# Patient Record
Sex: Male | Born: 1945
Health system: Southern US, Community
[De-identification: ages and names within clinical notes are randomized; demographics above are authoritative.]

## PROBLEM LIST (undated history)

## (undated) DIAGNOSIS — N401 Enlarged prostate with lower urinary tract symptoms: Secondary | ICD-10-CM

## (undated) DIAGNOSIS — K760 Fatty (change of) liver, not elsewhere classified: Secondary | ICD-10-CM

## (undated) DIAGNOSIS — E119 Type 2 diabetes mellitus without complications: Secondary | ICD-10-CM

## (undated) DIAGNOSIS — N138 Other obstructive and reflux uropathy: Secondary | ICD-10-CM

## (undated) DIAGNOSIS — E785 Hyperlipidemia, unspecified: Secondary | ICD-10-CM

## (undated) DIAGNOSIS — I639 Cerebral infarction, unspecified: Secondary | ICD-10-CM

## (undated) HISTORY — DX: Benign prostatic hyperplasia with lower urinary tract symptoms: N40.1

## (undated) HISTORY — DX: Other obstructive and reflux uropathy: N13.8

## (undated) HISTORY — DX: Hyperlipidemia, unspecified: E78.5

## (undated) HISTORY — DX: Type 2 diabetes mellitus without complications: E11.9

## (undated) HISTORY — PX: CYSTOSCOPY: SUR368

## (undated) HISTORY — DX: Other obstructive and reflux uropathy: N40.1

## (undated) HISTORY — DX: Cerebral infarction, unspecified: I63.9

---
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
## 2016-05-31 DIAGNOSIS — R739 Hyperglycemia, unspecified: Secondary | ICD-10-CM | POA: Diagnosis not present

## 2016-05-31 DIAGNOSIS — R748 Abnormal levels of other serum enzymes: Secondary | ICD-10-CM | POA: Diagnosis not present

## 2016-05-31 DIAGNOSIS — E782 Mixed hyperlipidemia: Secondary | ICD-10-CM | POA: Diagnosis not present

## 2016-06-04 DIAGNOSIS — Z1389 Encounter for screening for other disorder: Secondary | ICD-10-CM | POA: Diagnosis not present

## 2016-06-04 DIAGNOSIS — R7301 Impaired fasting glucose: Secondary | ICD-10-CM | POA: Diagnosis not present

## 2016-06-04 DIAGNOSIS — N401 Enlarged prostate with lower urinary tract symptoms: Secondary | ICD-10-CM | POA: Diagnosis not present

## 2016-06-04 DIAGNOSIS — Z6839 Body mass index (BMI) 39.0-39.9, adult: Secondary | ICD-10-CM | POA: Diagnosis not present

## 2016-06-04 DIAGNOSIS — R748 Abnormal levels of other serum enzymes: Secondary | ICD-10-CM | POA: Diagnosis not present

## 2016-06-04 DIAGNOSIS — E782 Mixed hyperlipidemia: Secondary | ICD-10-CM | POA: Diagnosis not present

## 2016-11-26 DIAGNOSIS — E7800 Pure hypercholesterolemia, unspecified: Secondary | ICD-10-CM | POA: Diagnosis not present

## 2016-11-26 DIAGNOSIS — E782 Mixed hyperlipidemia: Secondary | ICD-10-CM | POA: Diagnosis not present

## 2016-11-26 DIAGNOSIS — R739 Hyperglycemia, unspecified: Secondary | ICD-10-CM | POA: Diagnosis not present

## 2016-12-03 DIAGNOSIS — Z6839 Body mass index (BMI) 39.0-39.9, adult: Secondary | ICD-10-CM | POA: Diagnosis not present

## 2016-12-03 DIAGNOSIS — E782 Mixed hyperlipidemia: Secondary | ICD-10-CM | POA: Diagnosis not present

## 2016-12-03 DIAGNOSIS — R7301 Impaired fasting glucose: Secondary | ICD-10-CM | POA: Diagnosis not present

## 2016-12-03 DIAGNOSIS — N401 Enlarged prostate with lower urinary tract symptoms: Secondary | ICD-10-CM | POA: Diagnosis not present

## 2016-12-03 DIAGNOSIS — R748 Abnormal levels of other serum enzymes: Secondary | ICD-10-CM | POA: Diagnosis not present

## 2017-04-01 DIAGNOSIS — R739 Hyperglycemia, unspecified: Secondary | ICD-10-CM | POA: Diagnosis not present

## 2017-04-01 DIAGNOSIS — R7301 Impaired fasting glucose: Secondary | ICD-10-CM | POA: Diagnosis not present

## 2017-04-01 DIAGNOSIS — E7800 Pure hypercholesterolemia, unspecified: Secondary | ICD-10-CM | POA: Diagnosis not present

## 2017-04-01 DIAGNOSIS — Z0001 Encounter for general adult medical examination with abnormal findings: Secondary | ICD-10-CM | POA: Diagnosis not present

## 2017-04-01 DIAGNOSIS — Z6836 Body mass index (BMI) 36.0-36.9, adult: Secondary | ICD-10-CM | POA: Diagnosis not present

## 2017-04-01 DIAGNOSIS — R0602 Shortness of breath: Secondary | ICD-10-CM | POA: Diagnosis not present

## 2017-04-01 DIAGNOSIS — E782 Mixed hyperlipidemia: Secondary | ICD-10-CM | POA: Diagnosis not present

## 2017-04-01 DIAGNOSIS — R5383 Other fatigue: Secondary | ICD-10-CM | POA: Diagnosis not present

## 2017-04-05 DIAGNOSIS — Z23 Encounter for immunization: Secondary | ICD-10-CM | POA: Diagnosis not present

## 2017-04-05 DIAGNOSIS — E782 Mixed hyperlipidemia: Secondary | ICD-10-CM | POA: Diagnosis not present

## 2017-04-05 DIAGNOSIS — N401 Enlarged prostate with lower urinary tract symptoms: Secondary | ICD-10-CM | POA: Diagnosis not present

## 2017-04-05 DIAGNOSIS — R748 Abnormal levels of other serum enzymes: Secondary | ICD-10-CM | POA: Diagnosis not present

## 2017-04-05 DIAGNOSIS — R7301 Impaired fasting glucose: Secondary | ICD-10-CM | POA: Diagnosis not present

## 2017-04-05 DIAGNOSIS — Z6838 Body mass index (BMI) 38.0-38.9, adult: Secondary | ICD-10-CM | POA: Diagnosis not present

## 2017-07-15 DIAGNOSIS — M9901 Segmental and somatic dysfunction of cervical region: Secondary | ICD-10-CM | POA: Diagnosis not present

## 2017-07-15 DIAGNOSIS — S338XXA Sprain of other parts of lumbar spine and pelvis, initial encounter: Secondary | ICD-10-CM | POA: Diagnosis not present

## 2017-07-15 DIAGNOSIS — M9903 Segmental and somatic dysfunction of lumbar region: Secondary | ICD-10-CM | POA: Diagnosis not present

## 2017-07-15 DIAGNOSIS — M47812 Spondylosis without myelopathy or radiculopathy, cervical region: Secondary | ICD-10-CM | POA: Diagnosis not present

## 2017-07-15 DIAGNOSIS — M9902 Segmental and somatic dysfunction of thoracic region: Secondary | ICD-10-CM | POA: Diagnosis not present

## 2017-07-15 DIAGNOSIS — M47814 Spondylosis without myelopathy or radiculopathy, thoracic region: Secondary | ICD-10-CM | POA: Diagnosis not present

## 2017-07-18 DIAGNOSIS — M9903 Segmental and somatic dysfunction of lumbar region: Secondary | ICD-10-CM | POA: Diagnosis not present

## 2017-07-18 DIAGNOSIS — M47814 Spondylosis without myelopathy or radiculopathy, thoracic region: Secondary | ICD-10-CM | POA: Diagnosis not present

## 2017-07-18 DIAGNOSIS — M9901 Segmental and somatic dysfunction of cervical region: Secondary | ICD-10-CM | POA: Diagnosis not present

## 2017-07-18 DIAGNOSIS — M9902 Segmental and somatic dysfunction of thoracic region: Secondary | ICD-10-CM | POA: Diagnosis not present

## 2017-07-18 DIAGNOSIS — S338XXA Sprain of other parts of lumbar spine and pelvis, initial encounter: Secondary | ICD-10-CM | POA: Diagnosis not present

## 2017-07-18 DIAGNOSIS — M47812 Spondylosis without myelopathy or radiculopathy, cervical region: Secondary | ICD-10-CM | POA: Diagnosis not present

## 2017-07-25 DIAGNOSIS — M47814 Spondylosis without myelopathy or radiculopathy, thoracic region: Secondary | ICD-10-CM | POA: Diagnosis not present

## 2017-07-25 DIAGNOSIS — S338XXA Sprain of other parts of lumbar spine and pelvis, initial encounter: Secondary | ICD-10-CM | POA: Diagnosis not present

## 2017-07-25 DIAGNOSIS — M9903 Segmental and somatic dysfunction of lumbar region: Secondary | ICD-10-CM | POA: Diagnosis not present

## 2017-07-25 DIAGNOSIS — M47812 Spondylosis without myelopathy or radiculopathy, cervical region: Secondary | ICD-10-CM | POA: Diagnosis not present

## 2017-07-25 DIAGNOSIS — M9902 Segmental and somatic dysfunction of thoracic region: Secondary | ICD-10-CM | POA: Diagnosis not present

## 2017-07-25 DIAGNOSIS — M9901 Segmental and somatic dysfunction of cervical region: Secondary | ICD-10-CM | POA: Diagnosis not present

## 2017-08-05 DIAGNOSIS — R739 Hyperglycemia, unspecified: Secondary | ICD-10-CM | POA: Diagnosis not present

## 2017-08-05 DIAGNOSIS — R748 Abnormal levels of other serum enzymes: Secondary | ICD-10-CM | POA: Diagnosis not present

## 2017-08-05 DIAGNOSIS — E782 Mixed hyperlipidemia: Secondary | ICD-10-CM | POA: Diagnosis not present

## 2017-08-05 DIAGNOSIS — E7800 Pure hypercholesterolemia, unspecified: Secondary | ICD-10-CM | POA: Diagnosis not present

## 2017-08-05 DIAGNOSIS — R7301 Impaired fasting glucose: Secondary | ICD-10-CM | POA: Diagnosis not present

## 2017-08-05 DIAGNOSIS — R5383 Other fatigue: Secondary | ICD-10-CM | POA: Diagnosis not present

## 2017-08-08 DIAGNOSIS — E782 Mixed hyperlipidemia: Secondary | ICD-10-CM | POA: Diagnosis not present

## 2017-08-08 DIAGNOSIS — R748 Abnormal levels of other serum enzymes: Secondary | ICD-10-CM | POA: Diagnosis not present

## 2017-08-08 DIAGNOSIS — N401 Enlarged prostate with lower urinary tract symptoms: Secondary | ICD-10-CM | POA: Diagnosis not present

## 2017-08-08 DIAGNOSIS — Z1389 Encounter for screening for other disorder: Secondary | ICD-10-CM | POA: Diagnosis not present

## 2017-08-08 DIAGNOSIS — R7301 Impaired fasting glucose: Secondary | ICD-10-CM | POA: Diagnosis not present

## 2017-08-08 DIAGNOSIS — Z6838 Body mass index (BMI) 38.0-38.9, adult: Secondary | ICD-10-CM | POA: Diagnosis not present

## 2017-08-22 DIAGNOSIS — H524 Presbyopia: Secondary | ICD-10-CM | POA: Diagnosis not present

## 2017-12-26 DIAGNOSIS — M9901 Segmental and somatic dysfunction of cervical region: Secondary | ICD-10-CM | POA: Diagnosis not present

## 2017-12-26 DIAGNOSIS — M9902 Segmental and somatic dysfunction of thoracic region: Secondary | ICD-10-CM | POA: Diagnosis not present

## 2017-12-26 DIAGNOSIS — M47814 Spondylosis without myelopathy or radiculopathy, thoracic region: Secondary | ICD-10-CM | POA: Diagnosis not present

## 2017-12-26 DIAGNOSIS — M9903 Segmental and somatic dysfunction of lumbar region: Secondary | ICD-10-CM | POA: Diagnosis not present

## 2017-12-26 DIAGNOSIS — M47812 Spondylosis without myelopathy or radiculopathy, cervical region: Secondary | ICD-10-CM | POA: Diagnosis not present

## 2017-12-26 DIAGNOSIS — S338XXA Sprain of other parts of lumbar spine and pelvis, initial encounter: Secondary | ICD-10-CM | POA: Diagnosis not present

## 2017-12-31 DIAGNOSIS — S338XXA Sprain of other parts of lumbar spine and pelvis, initial encounter: Secondary | ICD-10-CM | POA: Diagnosis not present

## 2017-12-31 DIAGNOSIS — M9903 Segmental and somatic dysfunction of lumbar region: Secondary | ICD-10-CM | POA: Diagnosis not present

## 2017-12-31 DIAGNOSIS — M9902 Segmental and somatic dysfunction of thoracic region: Secondary | ICD-10-CM | POA: Diagnosis not present

## 2017-12-31 DIAGNOSIS — M47814 Spondylosis without myelopathy or radiculopathy, thoracic region: Secondary | ICD-10-CM | POA: Diagnosis not present

## 2017-12-31 DIAGNOSIS — M9901 Segmental and somatic dysfunction of cervical region: Secondary | ICD-10-CM | POA: Diagnosis not present

## 2017-12-31 DIAGNOSIS — M47812 Spondylosis without myelopathy or radiculopathy, cervical region: Secondary | ICD-10-CM | POA: Diagnosis not present

## 2018-01-06 DIAGNOSIS — L247 Irritant contact dermatitis due to plants, except food: Secondary | ICD-10-CM | POA: Diagnosis not present

## 2018-01-15 DIAGNOSIS — M47812 Spondylosis without myelopathy or radiculopathy, cervical region: Secondary | ICD-10-CM | POA: Diagnosis not present

## 2018-01-15 DIAGNOSIS — S338XXA Sprain of other parts of lumbar spine and pelvis, initial encounter: Secondary | ICD-10-CM | POA: Diagnosis not present

## 2018-01-15 DIAGNOSIS — M9901 Segmental and somatic dysfunction of cervical region: Secondary | ICD-10-CM | POA: Diagnosis not present

## 2018-01-15 DIAGNOSIS — M9903 Segmental and somatic dysfunction of lumbar region: Secondary | ICD-10-CM | POA: Diagnosis not present

## 2018-01-15 DIAGNOSIS — M9902 Segmental and somatic dysfunction of thoracic region: Secondary | ICD-10-CM | POA: Diagnosis not present

## 2018-01-15 DIAGNOSIS — M47814 Spondylosis without myelopathy or radiculopathy, thoracic region: Secondary | ICD-10-CM | POA: Diagnosis not present

## 2018-02-07 DIAGNOSIS — R748 Abnormal levels of other serum enzymes: Secondary | ICD-10-CM | POA: Diagnosis not present

## 2018-02-07 DIAGNOSIS — E7800 Pure hypercholesterolemia, unspecified: Secondary | ICD-10-CM | POA: Diagnosis not present

## 2018-02-07 DIAGNOSIS — R739 Hyperglycemia, unspecified: Secondary | ICD-10-CM | POA: Diagnosis not present

## 2018-02-07 DIAGNOSIS — R5383 Other fatigue: Secondary | ICD-10-CM | POA: Diagnosis not present

## 2018-02-07 DIAGNOSIS — R7301 Impaired fasting glucose: Secondary | ICD-10-CM | POA: Diagnosis not present

## 2018-02-07 DIAGNOSIS — E782 Mixed hyperlipidemia: Secondary | ICD-10-CM | POA: Diagnosis not present

## 2018-02-10 DIAGNOSIS — E119 Type 2 diabetes mellitus without complications: Secondary | ICD-10-CM | POA: Diagnosis not present

## 2018-02-10 DIAGNOSIS — R7301 Impaired fasting glucose: Secondary | ICD-10-CM | POA: Diagnosis not present

## 2018-02-10 DIAGNOSIS — E782 Mixed hyperlipidemia: Secondary | ICD-10-CM | POA: Diagnosis not present

## 2018-02-10 DIAGNOSIS — R748 Abnormal levels of other serum enzymes: Secondary | ICD-10-CM | POA: Diagnosis not present

## 2018-02-10 DIAGNOSIS — L247 Irritant contact dermatitis due to plants, except food: Secondary | ICD-10-CM | POA: Diagnosis not present

## 2018-02-10 DIAGNOSIS — Z6838 Body mass index (BMI) 38.0-38.9, adult: Secondary | ICD-10-CM | POA: Diagnosis not present

## 2018-02-10 DIAGNOSIS — N401 Enlarged prostate with lower urinary tract symptoms: Secondary | ICD-10-CM | POA: Diagnosis not present

## 2018-03-26 DIAGNOSIS — M9901 Segmental and somatic dysfunction of cervical region: Secondary | ICD-10-CM | POA: Diagnosis not present

## 2018-03-26 DIAGNOSIS — M9903 Segmental and somatic dysfunction of lumbar region: Secondary | ICD-10-CM | POA: Diagnosis not present

## 2018-03-26 DIAGNOSIS — M47814 Spondylosis without myelopathy or radiculopathy, thoracic region: Secondary | ICD-10-CM | POA: Diagnosis not present

## 2018-03-26 DIAGNOSIS — M47812 Spondylosis without myelopathy or radiculopathy, cervical region: Secondary | ICD-10-CM | POA: Diagnosis not present

## 2018-03-26 DIAGNOSIS — S338XXA Sprain of other parts of lumbar spine and pelvis, initial encounter: Secondary | ICD-10-CM | POA: Diagnosis not present

## 2018-03-26 DIAGNOSIS — M9902 Segmental and somatic dysfunction of thoracic region: Secondary | ICD-10-CM | POA: Diagnosis not present

## 2018-06-09 DIAGNOSIS — E119 Type 2 diabetes mellitus without complications: Secondary | ICD-10-CM | POA: Diagnosis not present

## 2018-06-09 DIAGNOSIS — R7301 Impaired fasting glucose: Secondary | ICD-10-CM | POA: Diagnosis not present

## 2018-06-09 DIAGNOSIS — R748 Abnormal levels of other serum enzymes: Secondary | ICD-10-CM | POA: Diagnosis not present

## 2018-06-09 DIAGNOSIS — R739 Hyperglycemia, unspecified: Secondary | ICD-10-CM | POA: Diagnosis not present

## 2018-06-09 DIAGNOSIS — E782 Mixed hyperlipidemia: Secondary | ICD-10-CM | POA: Diagnosis not present

## 2018-06-12 DIAGNOSIS — Z6838 Body mass index (BMI) 38.0-38.9, adult: Secondary | ICD-10-CM | POA: Diagnosis not present

## 2018-06-12 DIAGNOSIS — E782 Mixed hyperlipidemia: Secondary | ICD-10-CM | POA: Diagnosis not present

## 2018-06-12 DIAGNOSIS — R748 Abnormal levels of other serum enzymes: Secondary | ICD-10-CM | POA: Diagnosis not present

## 2018-06-12 DIAGNOSIS — N401 Enlarged prostate with lower urinary tract symptoms: Secondary | ICD-10-CM | POA: Diagnosis not present

## 2018-06-12 DIAGNOSIS — L247 Irritant contact dermatitis due to plants, except food: Secondary | ICD-10-CM | POA: Diagnosis not present

## 2018-06-12 DIAGNOSIS — E119 Type 2 diabetes mellitus without complications: Secondary | ICD-10-CM | POA: Diagnosis not present

## 2018-06-12 DIAGNOSIS — R7301 Impaired fasting glucose: Secondary | ICD-10-CM | POA: Diagnosis not present

## 2018-07-21 DIAGNOSIS — L280 Lichen simplex chronicus: Secondary | ICD-10-CM | POA: Diagnosis not present

## 2018-07-21 DIAGNOSIS — L821 Other seborrheic keratosis: Secondary | ICD-10-CM | POA: Diagnosis not present

## 2018-07-21 DIAGNOSIS — L570 Actinic keratosis: Secondary | ICD-10-CM | POA: Diagnosis not present

## 2018-09-29 DIAGNOSIS — R7301 Impaired fasting glucose: Secondary | ICD-10-CM | POA: Diagnosis not present

## 2018-09-29 DIAGNOSIS — R748 Abnormal levels of other serum enzymes: Secondary | ICD-10-CM | POA: Diagnosis not present

## 2018-09-29 DIAGNOSIS — R739 Hyperglycemia, unspecified: Secondary | ICD-10-CM | POA: Diagnosis not present

## 2018-09-29 DIAGNOSIS — E782 Mixed hyperlipidemia: Secondary | ICD-10-CM | POA: Diagnosis not present

## 2018-09-29 DIAGNOSIS — E119 Type 2 diabetes mellitus without complications: Secondary | ICD-10-CM | POA: Diagnosis not present

## 2018-10-03 DIAGNOSIS — Z6836 Body mass index (BMI) 36.0-36.9, adult: Secondary | ICD-10-CM | POA: Diagnosis not present

## 2018-10-03 DIAGNOSIS — N401 Enlarged prostate with lower urinary tract symptoms: Secondary | ICD-10-CM | POA: Diagnosis not present

## 2018-10-03 DIAGNOSIS — E119 Type 2 diabetes mellitus without complications: Secondary | ICD-10-CM | POA: Diagnosis not present

## 2018-10-03 DIAGNOSIS — E782 Mixed hyperlipidemia: Secondary | ICD-10-CM | POA: Diagnosis not present

## 2018-10-03 DIAGNOSIS — R748 Abnormal levels of other serum enzymes: Secondary | ICD-10-CM | POA: Diagnosis not present

## 2018-10-03 DIAGNOSIS — R7301 Impaired fasting glucose: Secondary | ICD-10-CM | POA: Diagnosis not present

## 2019-04-03 DIAGNOSIS — R748 Abnormal levels of other serum enzymes: Secondary | ICD-10-CM | POA: Diagnosis not present

## 2019-04-03 DIAGNOSIS — E782 Mixed hyperlipidemia: Secondary | ICD-10-CM | POA: Diagnosis not present

## 2019-04-03 DIAGNOSIS — R7301 Impaired fasting glucose: Secondary | ICD-10-CM | POA: Diagnosis not present

## 2019-04-03 DIAGNOSIS — E119 Type 2 diabetes mellitus without complications: Secondary | ICD-10-CM | POA: Diagnosis not present

## 2019-04-03 DIAGNOSIS — R739 Hyperglycemia, unspecified: Secondary | ICD-10-CM | POA: Diagnosis not present

## 2019-04-06 DIAGNOSIS — N401 Enlarged prostate with lower urinary tract symptoms: Secondary | ICD-10-CM | POA: Diagnosis not present

## 2019-04-06 DIAGNOSIS — Z6836 Body mass index (BMI) 36.0-36.9, adult: Secondary | ICD-10-CM | POA: Diagnosis not present

## 2019-04-06 DIAGNOSIS — E782 Mixed hyperlipidemia: Secondary | ICD-10-CM | POA: Diagnosis not present

## 2019-04-06 DIAGNOSIS — R748 Abnormal levels of other serum enzymes: Secondary | ICD-10-CM | POA: Diagnosis not present

## 2019-04-06 DIAGNOSIS — R7301 Impaired fasting glucose: Secondary | ICD-10-CM | POA: Diagnosis not present

## 2019-05-04 DIAGNOSIS — E785 Hyperlipidemia, unspecified: Secondary | ICD-10-CM | POA: Diagnosis not present

## 2019-05-04 DIAGNOSIS — E669 Obesity, unspecified: Secondary | ICD-10-CM | POA: Diagnosis not present

## 2019-05-04 DIAGNOSIS — Z6834 Body mass index (BMI) 34.0-34.9, adult: Secondary | ICD-10-CM | POA: Diagnosis not present

## 2019-05-04 DIAGNOSIS — Z87891 Personal history of nicotine dependence: Secondary | ICD-10-CM | POA: Diagnosis not present

## 2019-05-04 DIAGNOSIS — N400 Enlarged prostate without lower urinary tract symptoms: Secondary | ICD-10-CM | POA: Diagnosis not present

## 2019-06-03 DIAGNOSIS — Z5321 Procedure and treatment not carried out due to patient leaving prior to being seen by health care provider: Secondary | ICD-10-CM | POA: Diagnosis not present

## 2019-06-03 DIAGNOSIS — R109 Unspecified abdominal pain: Secondary | ICD-10-CM | POA: Diagnosis not present

## 2019-06-04 DIAGNOSIS — Z6836 Body mass index (BMI) 36.0-36.9, adult: Secondary | ICD-10-CM | POA: Diagnosis not present

## 2019-06-04 DIAGNOSIS — N200 Calculus of kidney: Secondary | ICD-10-CM | POA: Diagnosis not present

## 2019-07-17 ENCOUNTER — Telehealth: Payer: Self-pay | Admitting: *Deleted

## 2019-07-17 NOTE — Telephone Encounter (Signed)
Pt and wife called to cancel covid vaccine due to exposure. Will call back to schedule.

## 2019-07-19 ENCOUNTER — Ambulatory Visit: Payer: Self-pay

## 2019-07-20 DIAGNOSIS — Z20828 Contact with and (suspected) exposure to other viral communicable diseases: Secondary | ICD-10-CM | POA: Diagnosis not present

## 2019-07-22 DIAGNOSIS — L570 Actinic keratosis: Secondary | ICD-10-CM | POA: Diagnosis not present

## 2019-07-22 DIAGNOSIS — L819 Disorder of pigmentation, unspecified: Secondary | ICD-10-CM | POA: Diagnosis not present

## 2019-07-22 DIAGNOSIS — D1801 Hemangioma of skin and subcutaneous tissue: Secondary | ICD-10-CM | POA: Diagnosis not present

## 2019-07-22 DIAGNOSIS — L304 Erythema intertrigo: Secondary | ICD-10-CM | POA: Diagnosis not present

## 2019-10-06 DIAGNOSIS — E782 Mixed hyperlipidemia: Secondary | ICD-10-CM | POA: Diagnosis not present

## 2019-10-06 DIAGNOSIS — R5383 Other fatigue: Secondary | ICD-10-CM | POA: Diagnosis not present

## 2019-10-06 DIAGNOSIS — R748 Abnormal levels of other serum enzymes: Secondary | ICD-10-CM | POA: Diagnosis not present

## 2019-10-06 DIAGNOSIS — E119 Type 2 diabetes mellitus without complications: Secondary | ICD-10-CM | POA: Diagnosis not present

## 2019-10-15 DIAGNOSIS — Z6836 Body mass index (BMI) 36.0-36.9, adult: Secondary | ICD-10-CM | POA: Diagnosis not present

## 2019-10-15 DIAGNOSIS — R7301 Impaired fasting glucose: Secondary | ICD-10-CM | POA: Diagnosis not present

## 2019-10-15 DIAGNOSIS — R748 Abnormal levels of other serum enzymes: Secondary | ICD-10-CM | POA: Diagnosis not present

## 2019-10-15 DIAGNOSIS — E782 Mixed hyperlipidemia: Secondary | ICD-10-CM | POA: Diagnosis not present

## 2019-10-15 DIAGNOSIS — Z0001 Encounter for general adult medical examination with abnormal findings: Secondary | ICD-10-CM | POA: Diagnosis not present

## 2019-10-15 DIAGNOSIS — N401 Enlarged prostate with lower urinary tract symptoms: Secondary | ICD-10-CM | POA: Diagnosis not present

## 2019-11-30 DIAGNOSIS — M47814 Spondylosis without myelopathy or radiculopathy, thoracic region: Secondary | ICD-10-CM | POA: Diagnosis not present

## 2019-11-30 DIAGNOSIS — S338XXA Sprain of other parts of lumbar spine and pelvis, initial encounter: Secondary | ICD-10-CM | POA: Diagnosis not present

## 2019-11-30 DIAGNOSIS — M9902 Segmental and somatic dysfunction of thoracic region: Secondary | ICD-10-CM | POA: Diagnosis not present

## 2019-11-30 DIAGNOSIS — M9901 Segmental and somatic dysfunction of cervical region: Secondary | ICD-10-CM | POA: Diagnosis not present

## 2019-11-30 DIAGNOSIS — M9903 Segmental and somatic dysfunction of lumbar region: Secondary | ICD-10-CM | POA: Diagnosis not present

## 2019-11-30 DIAGNOSIS — M47812 Spondylosis without myelopathy or radiculopathy, cervical region: Secondary | ICD-10-CM | POA: Diagnosis not present

## 2019-12-07 DIAGNOSIS — M9902 Segmental and somatic dysfunction of thoracic region: Secondary | ICD-10-CM | POA: Diagnosis not present

## 2019-12-07 DIAGNOSIS — M9901 Segmental and somatic dysfunction of cervical region: Secondary | ICD-10-CM | POA: Diagnosis not present

## 2019-12-07 DIAGNOSIS — M47814 Spondylosis without myelopathy or radiculopathy, thoracic region: Secondary | ICD-10-CM | POA: Diagnosis not present

## 2019-12-07 DIAGNOSIS — M47812 Spondylosis without myelopathy or radiculopathy, cervical region: Secondary | ICD-10-CM | POA: Diagnosis not present

## 2019-12-07 DIAGNOSIS — S338XXA Sprain of other parts of lumbar spine and pelvis, initial encounter: Secondary | ICD-10-CM | POA: Diagnosis not present

## 2019-12-07 DIAGNOSIS — M9903 Segmental and somatic dysfunction of lumbar region: Secondary | ICD-10-CM | POA: Diagnosis not present

## 2020-04-11 DIAGNOSIS — E7801 Familial hypercholesterolemia: Secondary | ICD-10-CM | POA: Diagnosis not present

## 2020-04-11 DIAGNOSIS — R748 Abnormal levels of other serum enzymes: Secondary | ICD-10-CM | POA: Diagnosis not present

## 2020-04-11 DIAGNOSIS — E7800 Pure hypercholesterolemia, unspecified: Secondary | ICD-10-CM | POA: Diagnosis not present

## 2020-04-11 DIAGNOSIS — E119 Type 2 diabetes mellitus without complications: Secondary | ICD-10-CM | POA: Diagnosis not present

## 2020-04-11 DIAGNOSIS — R5383 Other fatigue: Secondary | ICD-10-CM | POA: Diagnosis not present

## 2020-04-11 DIAGNOSIS — Z20828 Contact with and (suspected) exposure to other viral communicable diseases: Secondary | ICD-10-CM | POA: Diagnosis not present

## 2020-04-11 DIAGNOSIS — E782 Mixed hyperlipidemia: Secondary | ICD-10-CM | POA: Diagnosis not present

## 2020-04-14 DIAGNOSIS — N401 Enlarged prostate with lower urinary tract symptoms: Secondary | ICD-10-CM | POA: Diagnosis not present

## 2020-04-14 DIAGNOSIS — E782 Mixed hyperlipidemia: Secondary | ICD-10-CM | POA: Diagnosis not present

## 2020-04-14 DIAGNOSIS — Z23 Encounter for immunization: Secondary | ICD-10-CM | POA: Diagnosis not present

## 2020-04-14 DIAGNOSIS — E119 Type 2 diabetes mellitus without complications: Secondary | ICD-10-CM | POA: Diagnosis not present

## 2020-04-14 DIAGNOSIS — E1165 Type 2 diabetes mellitus with hyperglycemia: Secondary | ICD-10-CM | POA: Diagnosis not present

## 2020-04-14 DIAGNOSIS — R748 Abnormal levels of other serum enzymes: Secondary | ICD-10-CM | POA: Diagnosis not present

## 2020-04-14 DIAGNOSIS — Z6837 Body mass index (BMI) 37.0-37.9, adult: Secondary | ICD-10-CM | POA: Diagnosis not present

## 2020-05-18 DIAGNOSIS — Z20828 Contact with and (suspected) exposure to other viral communicable diseases: Secondary | ICD-10-CM | POA: Diagnosis not present

## 2020-07-20 DIAGNOSIS — B354 Tinea corporis: Secondary | ICD-10-CM | POA: Diagnosis not present

## 2020-07-20 DIAGNOSIS — L570 Actinic keratosis: Secondary | ICD-10-CM | POA: Diagnosis not present

## 2020-08-15 DIAGNOSIS — R7301 Impaired fasting glucose: Secondary | ICD-10-CM | POA: Diagnosis not present

## 2020-08-15 DIAGNOSIS — E782 Mixed hyperlipidemia: Secondary | ICD-10-CM | POA: Diagnosis not present

## 2020-08-15 DIAGNOSIS — E7849 Other hyperlipidemia: Secondary | ICD-10-CM | POA: Diagnosis not present

## 2020-08-15 DIAGNOSIS — E119 Type 2 diabetes mellitus without complications: Secondary | ICD-10-CM | POA: Diagnosis not present

## 2020-08-15 DIAGNOSIS — R739 Hyperglycemia, unspecified: Secondary | ICD-10-CM | POA: Diagnosis not present

## 2020-08-19 DIAGNOSIS — R748 Abnormal levels of other serum enzymes: Secondary | ICD-10-CM | POA: Diagnosis not present

## 2020-08-19 DIAGNOSIS — E1129 Type 2 diabetes mellitus with other diabetic kidney complication: Secondary | ICD-10-CM | POA: Diagnosis not present

## 2020-08-19 DIAGNOSIS — E7849 Other hyperlipidemia: Secondary | ICD-10-CM | POA: Diagnosis not present

## 2020-08-19 DIAGNOSIS — E1165 Type 2 diabetes mellitus with hyperglycemia: Secondary | ICD-10-CM | POA: Diagnosis not present

## 2020-08-19 DIAGNOSIS — N401 Enlarged prostate with lower urinary tract symptoms: Secondary | ICD-10-CM | POA: Diagnosis not present

## 2020-08-19 DIAGNOSIS — T466X5A Adverse effect of antihyperlipidemic and antiarteriosclerotic drugs, initial encounter: Secondary | ICD-10-CM | POA: Diagnosis not present

## 2020-08-19 DIAGNOSIS — M791 Myalgia, unspecified site: Secondary | ICD-10-CM | POA: Diagnosis not present

## 2020-08-19 DIAGNOSIS — I10 Essential (primary) hypertension: Secondary | ICD-10-CM | POA: Diagnosis not present

## 2020-08-19 DIAGNOSIS — R809 Proteinuria, unspecified: Secondary | ICD-10-CM | POA: Diagnosis not present

## 2020-10-28 DIAGNOSIS — Z20828 Contact with and (suspected) exposure to other viral communicable diseases: Secondary | ICD-10-CM | POA: Diagnosis not present

## 2020-10-28 DIAGNOSIS — R059 Cough, unspecified: Secondary | ICD-10-CM | POA: Diagnosis not present

## 2020-12-12 DIAGNOSIS — R5383 Other fatigue: Secondary | ICD-10-CM | POA: Diagnosis not present

## 2020-12-12 DIAGNOSIS — E1129 Type 2 diabetes mellitus with other diabetic kidney complication: Secondary | ICD-10-CM | POA: Diagnosis not present

## 2020-12-12 DIAGNOSIS — E782 Mixed hyperlipidemia: Secondary | ICD-10-CM | POA: Diagnosis not present

## 2020-12-12 DIAGNOSIS — E7849 Other hyperlipidemia: Secondary | ICD-10-CM | POA: Diagnosis not present

## 2020-12-12 DIAGNOSIS — I10 Essential (primary) hypertension: Secondary | ICD-10-CM | POA: Diagnosis not present

## 2020-12-15 DIAGNOSIS — Z1331 Encounter for screening for depression: Secondary | ICD-10-CM | POA: Diagnosis not present

## 2020-12-15 DIAGNOSIS — R748 Abnormal levels of other serum enzymes: Secondary | ICD-10-CM | POA: Diagnosis not present

## 2020-12-15 DIAGNOSIS — E7849 Other hyperlipidemia: Secondary | ICD-10-CM | POA: Diagnosis not present

## 2020-12-15 DIAGNOSIS — R809 Proteinuria, unspecified: Secondary | ICD-10-CM | POA: Diagnosis not present

## 2020-12-15 DIAGNOSIS — I10 Essential (primary) hypertension: Secondary | ICD-10-CM | POA: Diagnosis not present

## 2020-12-15 DIAGNOSIS — Z23 Encounter for immunization: Secondary | ICD-10-CM | POA: Diagnosis not present

## 2020-12-15 DIAGNOSIS — Z1389 Encounter for screening for other disorder: Secondary | ICD-10-CM | POA: Diagnosis not present

## 2020-12-15 DIAGNOSIS — N401 Enlarged prostate with lower urinary tract symptoms: Secondary | ICD-10-CM | POA: Diagnosis not present

## 2021-01-26 DIAGNOSIS — I639 Cerebral infarction, unspecified: Secondary | ICD-10-CM

## 2021-01-26 HISTORY — DX: Cerebral infarction, unspecified: I63.9

## 2021-01-30 DIAGNOSIS — A4151 Sepsis due to Escherichia coli [E. coli]: Secondary | ICD-10-CM | POA: Diagnosis not present

## 2021-01-30 DIAGNOSIS — N138 Other obstructive and reflux uropathy: Secondary | ICD-10-CM | POA: Diagnosis not present

## 2021-01-30 DIAGNOSIS — J9601 Acute respiratory failure with hypoxia: Secondary | ICD-10-CM | POA: Diagnosis not present

## 2021-01-30 DIAGNOSIS — R4781 Slurred speech: Secondary | ICD-10-CM | POA: Diagnosis not present

## 2021-01-30 DIAGNOSIS — R7401 Elevation of levels of liver transaminase levels: Secondary | ICD-10-CM | POA: Diagnosis not present

## 2021-01-30 DIAGNOSIS — N201 Calculus of ureter: Secondary | ICD-10-CM | POA: Diagnosis not present

## 2021-01-30 DIAGNOSIS — N139 Obstructive and reflux uropathy, unspecified: Secondary | ICD-10-CM | POA: Diagnosis not present

## 2021-01-30 DIAGNOSIS — J9692 Respiratory failure, unspecified with hypercapnia: Secondary | ICD-10-CM | POA: Diagnosis not present

## 2021-01-30 DIAGNOSIS — J189 Pneumonia, unspecified organism: Secondary | ICD-10-CM | POA: Diagnosis not present

## 2021-01-30 DIAGNOSIS — J810 Acute pulmonary edema: Secondary | ICD-10-CM | POA: Diagnosis not present

## 2021-01-30 DIAGNOSIS — Z20822 Contact with and (suspected) exposure to covid-19: Secondary | ICD-10-CM | POA: Diagnosis not present

## 2021-01-30 DIAGNOSIS — I509 Heart failure, unspecified: Secondary | ICD-10-CM | POA: Diagnosis not present

## 2021-01-30 DIAGNOSIS — E669 Obesity, unspecified: Secondary | ICD-10-CM | POA: Diagnosis not present

## 2021-01-30 DIAGNOSIS — G9341 Metabolic encephalopathy: Secondary | ICD-10-CM | POA: Diagnosis not present

## 2021-01-30 DIAGNOSIS — R918 Other nonspecific abnormal finding of lung field: Secondary | ICD-10-CM | POA: Diagnosis not present

## 2021-01-30 DIAGNOSIS — R6521 Severe sepsis with septic shock: Secondary | ICD-10-CM | POA: Insufficient documentation

## 2021-01-30 DIAGNOSIS — F10230 Alcohol dependence with withdrawal, uncomplicated: Secondary | ICD-10-CM | POA: Diagnosis not present

## 2021-01-30 DIAGNOSIS — I671 Cerebral aneurysm, nonruptured: Secondary | ICD-10-CM | POA: Diagnosis not present

## 2021-01-30 DIAGNOSIS — G4733 Obstructive sleep apnea (adult) (pediatric): Secondary | ICD-10-CM | POA: Diagnosis not present

## 2021-01-30 DIAGNOSIS — F10931 Alcohol use, unspecified with withdrawal delirium: Secondary | ICD-10-CM | POA: Diagnosis not present

## 2021-01-30 DIAGNOSIS — Z515 Encounter for palliative care: Secondary | ICD-10-CM | POA: Diagnosis not present

## 2021-01-30 DIAGNOSIS — G928 Other toxic encephalopathy: Secondary | ICD-10-CM | POA: Diagnosis not present

## 2021-01-30 DIAGNOSIS — A419 Sepsis, unspecified organism: Secondary | ICD-10-CM | POA: Insufficient documentation

## 2021-01-30 DIAGNOSIS — R531 Weakness: Secondary | ICD-10-CM | POA: Diagnosis not present

## 2021-01-30 DIAGNOSIS — R4182 Altered mental status, unspecified: Secondary | ICD-10-CM | POA: Diagnosis not present

## 2021-01-30 DIAGNOSIS — J9811 Atelectasis: Secondary | ICD-10-CM | POA: Diagnosis not present

## 2021-01-30 DIAGNOSIS — Z4682 Encounter for fitting and adjustment of non-vascular catheter: Secondary | ICD-10-CM | POA: Diagnosis not present

## 2021-01-30 DIAGNOSIS — R7881 Bacteremia: Secondary | ICD-10-CM | POA: Diagnosis not present

## 2021-01-30 DIAGNOSIS — N390 Urinary tract infection, site not specified: Secondary | ICD-10-CM | POA: Diagnosis not present

## 2021-01-30 DIAGNOSIS — N179 Acute kidney failure, unspecified: Secondary | ICD-10-CM | POA: Diagnosis not present

## 2021-01-30 DIAGNOSIS — I6782 Cerebral ischemia: Secondary | ICD-10-CM | POA: Diagnosis not present

## 2021-01-30 DIAGNOSIS — J90 Pleural effusion, not elsewhere classified: Secondary | ICD-10-CM | POA: Diagnosis not present

## 2021-01-30 DIAGNOSIS — E7800 Pure hypercholesterolemia, unspecified: Secondary | ICD-10-CM | POA: Diagnosis not present

## 2021-01-30 DIAGNOSIS — N3000 Acute cystitis without hematuria: Secondary | ICD-10-CM | POA: Diagnosis not present

## 2021-01-30 DIAGNOSIS — R7989 Other specified abnormal findings of blood chemistry: Secondary | ICD-10-CM | POA: Diagnosis not present

## 2021-01-30 DIAGNOSIS — N189 Chronic kidney disease, unspecified: Secondary | ICD-10-CM | POA: Diagnosis not present

## 2021-01-30 DIAGNOSIS — G459 Transient cerebral ischemic attack, unspecified: Secondary | ICD-10-CM | POA: Diagnosis not present

## 2021-01-30 DIAGNOSIS — R1110 Vomiting, unspecified: Secondary | ICD-10-CM | POA: Diagnosis not present

## 2021-01-30 DIAGNOSIS — G319 Degenerative disease of nervous system, unspecified: Secondary | ICD-10-CM | POA: Diagnosis not present

## 2021-01-30 DIAGNOSIS — R1312 Dysphagia, oropharyngeal phase: Secondary | ICD-10-CM | POA: Diagnosis not present

## 2021-01-30 DIAGNOSIS — N132 Hydronephrosis with renal and ureteral calculous obstruction: Secondary | ICD-10-CM | POA: Diagnosis not present

## 2021-01-30 DIAGNOSIS — Z87891 Personal history of nicotine dependence: Secondary | ICD-10-CM | POA: Diagnosis not present

## 2021-01-30 DIAGNOSIS — Z6835 Body mass index (BMI) 35.0-35.9, adult: Secondary | ICD-10-CM | POA: Diagnosis not present

## 2021-01-30 DIAGNOSIS — J9602 Acute respiratory failure with hypercapnia: Secondary | ICD-10-CM | POA: Diagnosis not present

## 2021-01-30 DIAGNOSIS — R059 Cough, unspecified: Secondary | ICD-10-CM | POA: Diagnosis not present

## 2021-01-30 DIAGNOSIS — Z466 Encounter for fitting and adjustment of urinary device: Secondary | ICD-10-CM | POA: Diagnosis not present

## 2021-01-30 DIAGNOSIS — F10231 Alcohol dependence with withdrawal delirium: Secondary | ICD-10-CM | POA: Diagnosis not present

## 2021-01-30 DIAGNOSIS — N12 Tubulo-interstitial nephritis, not specified as acute or chronic: Secondary | ICD-10-CM | POA: Diagnosis not present

## 2021-01-30 DIAGNOSIS — N209 Urinary calculus, unspecified: Secondary | ICD-10-CM | POA: Diagnosis not present

## 2021-01-30 DIAGNOSIS — R6520 Severe sepsis without septic shock: Secondary | ICD-10-CM | POA: Diagnosis not present

## 2021-01-30 DIAGNOSIS — R509 Fever, unspecified: Secondary | ICD-10-CM | POA: Diagnosis not present

## 2021-01-30 DIAGNOSIS — J9622 Acute and chronic respiratory failure with hypercapnia: Secondary | ICD-10-CM | POA: Diagnosis not present

## 2021-01-30 DIAGNOSIS — K5730 Diverticulosis of large intestine without perforation or abscess without bleeding: Secondary | ICD-10-CM | POA: Diagnosis not present

## 2021-01-31 DIAGNOSIS — Z6835 Body mass index (BMI) 35.0-35.9, adult: Secondary | ICD-10-CM | POA: Diagnosis not present

## 2021-01-31 DIAGNOSIS — R6521 Severe sepsis with septic shock: Secondary | ICD-10-CM | POA: Diagnosis not present

## 2021-01-31 DIAGNOSIS — E669 Obesity, unspecified: Secondary | ICD-10-CM | POA: Insufficient documentation

## 2021-01-31 DIAGNOSIS — I671 Cerebral aneurysm, nonruptured: Secondary | ICD-10-CM | POA: Diagnosis not present

## 2021-01-31 DIAGNOSIS — A419 Sepsis, unspecified organism: Secondary | ICD-10-CM | POA: Diagnosis not present

## 2021-01-31 DIAGNOSIS — Z87891 Personal history of nicotine dependence: Secondary | ICD-10-CM | POA: Diagnosis not present

## 2021-01-31 DIAGNOSIS — G319 Degenerative disease of nervous system, unspecified: Secondary | ICD-10-CM | POA: Diagnosis not present

## 2021-01-31 DIAGNOSIS — I6782 Cerebral ischemia: Secondary | ICD-10-CM | POA: Diagnosis not present

## 2021-01-31 DIAGNOSIS — N132 Hydronephrosis with renal and ureteral calculous obstruction: Secondary | ICD-10-CM | POA: Diagnosis not present

## 2021-01-31 DIAGNOSIS — G459 Transient cerebral ischemic attack, unspecified: Secondary | ICD-10-CM | POA: Diagnosis not present

## 2021-02-01 DIAGNOSIS — A419 Sepsis, unspecified organism: Secondary | ICD-10-CM | POA: Diagnosis not present

## 2021-02-01 DIAGNOSIS — N132 Hydronephrosis with renal and ureteral calculous obstruction: Secondary | ICD-10-CM | POA: Diagnosis not present

## 2021-02-01 DIAGNOSIS — G459 Transient cerebral ischemic attack, unspecified: Secondary | ICD-10-CM | POA: Diagnosis not present

## 2021-02-01 DIAGNOSIS — R6521 Severe sepsis with septic shock: Secondary | ICD-10-CM | POA: Diagnosis not present

## 2021-02-01 DIAGNOSIS — J90 Pleural effusion, not elsewhere classified: Secondary | ICD-10-CM | POA: Diagnosis not present

## 2021-02-01 DIAGNOSIS — I671 Cerebral aneurysm, nonruptured: Secondary | ICD-10-CM | POA: Diagnosis not present

## 2021-02-02 DIAGNOSIS — R6521 Severe sepsis with septic shock: Secondary | ICD-10-CM | POA: Diagnosis not present

## 2021-02-02 DIAGNOSIS — Z6835 Body mass index (BMI) 35.0-35.9, adult: Secondary | ICD-10-CM | POA: Diagnosis not present

## 2021-02-02 DIAGNOSIS — N3000 Acute cystitis without hematuria: Secondary | ICD-10-CM | POA: Diagnosis not present

## 2021-02-02 DIAGNOSIS — A419 Sepsis, unspecified organism: Secondary | ICD-10-CM | POA: Diagnosis not present

## 2021-02-02 DIAGNOSIS — Z87891 Personal history of nicotine dependence: Secondary | ICD-10-CM | POA: Diagnosis not present

## 2021-02-03 DIAGNOSIS — R6521 Severe sepsis with septic shock: Secondary | ICD-10-CM | POA: Diagnosis not present

## 2021-02-03 DIAGNOSIS — R4182 Altered mental status, unspecified: Secondary | ICD-10-CM | POA: Diagnosis not present

## 2021-02-03 DIAGNOSIS — A419 Sepsis, unspecified organism: Secondary | ICD-10-CM | POA: Diagnosis not present

## 2021-02-03 DIAGNOSIS — Z4682 Encounter for fitting and adjustment of non-vascular catheter: Secondary | ICD-10-CM | POA: Diagnosis not present

## 2021-02-04 DIAGNOSIS — Z4682 Encounter for fitting and adjustment of non-vascular catheter: Secondary | ICD-10-CM | POA: Diagnosis not present

## 2021-02-04 DIAGNOSIS — A419 Sepsis, unspecified organism: Secondary | ICD-10-CM | POA: Diagnosis not present

## 2021-02-04 DIAGNOSIS — R6521 Severe sepsis with septic shock: Secondary | ICD-10-CM | POA: Diagnosis not present

## 2021-02-05 DIAGNOSIS — J90 Pleural effusion, not elsewhere classified: Secondary | ICD-10-CM | POA: Diagnosis not present

## 2021-02-05 DIAGNOSIS — R6521 Severe sepsis with septic shock: Secondary | ICD-10-CM | POA: Diagnosis not present

## 2021-02-05 DIAGNOSIS — A419 Sepsis, unspecified organism: Secondary | ICD-10-CM | POA: Diagnosis not present

## 2021-02-06 DIAGNOSIS — I509 Heart failure, unspecified: Secondary | ICD-10-CM | POA: Diagnosis not present

## 2021-02-06 DIAGNOSIS — R4182 Altered mental status, unspecified: Secondary | ICD-10-CM | POA: Diagnosis not present

## 2021-02-06 DIAGNOSIS — R7881 Bacteremia: Secondary | ICD-10-CM | POA: Diagnosis not present

## 2021-02-06 DIAGNOSIS — N390 Urinary tract infection, site not specified: Secondary | ICD-10-CM | POA: Diagnosis not present

## 2021-02-06 DIAGNOSIS — A419 Sepsis, unspecified organism: Secondary | ICD-10-CM | POA: Diagnosis not present

## 2021-02-07 DIAGNOSIS — R6521 Severe sepsis with septic shock: Secondary | ICD-10-CM | POA: Diagnosis not present

## 2021-02-07 DIAGNOSIS — N138 Other obstructive and reflux uropathy: Secondary | ICD-10-CM | POA: Diagnosis not present

## 2021-02-07 DIAGNOSIS — Z4682 Encounter for fitting and adjustment of non-vascular catheter: Secondary | ICD-10-CM | POA: Diagnosis not present

## 2021-02-07 DIAGNOSIS — R918 Other nonspecific abnormal finding of lung field: Secondary | ICD-10-CM | POA: Diagnosis not present

## 2021-02-07 DIAGNOSIS — J90 Pleural effusion, not elsewhere classified: Secondary | ICD-10-CM | POA: Diagnosis not present

## 2021-02-07 DIAGNOSIS — I671 Cerebral aneurysm, nonruptured: Secondary | ICD-10-CM | POA: Diagnosis not present

## 2021-02-07 DIAGNOSIS — J9692 Respiratory failure, unspecified with hypercapnia: Secondary | ICD-10-CM | POA: Diagnosis not present

## 2021-02-07 DIAGNOSIS — E669 Obesity, unspecified: Secondary | ICD-10-CM | POA: Diagnosis not present

## 2021-02-07 DIAGNOSIS — N209 Urinary calculus, unspecified: Secondary | ICD-10-CM | POA: Diagnosis not present

## 2021-02-07 DIAGNOSIS — A419 Sepsis, unspecified organism: Secondary | ICD-10-CM | POA: Diagnosis not present

## 2021-02-07 DIAGNOSIS — J9601 Acute respiratory failure with hypoxia: Secondary | ICD-10-CM | POA: Diagnosis not present

## 2021-02-07 DIAGNOSIS — N390 Urinary tract infection, site not specified: Secondary | ICD-10-CM | POA: Diagnosis not present

## 2021-02-08 DIAGNOSIS — N209 Urinary calculus, unspecified: Secondary | ICD-10-CM | POA: Diagnosis not present

## 2021-02-08 DIAGNOSIS — A419 Sepsis, unspecified organism: Secondary | ICD-10-CM | POA: Diagnosis not present

## 2021-02-08 DIAGNOSIS — J90 Pleural effusion, not elsewhere classified: Secondary | ICD-10-CM | POA: Diagnosis not present

## 2021-02-08 DIAGNOSIS — N138 Other obstructive and reflux uropathy: Secondary | ICD-10-CM | POA: Diagnosis not present

## 2021-02-08 DIAGNOSIS — R6521 Severe sepsis with septic shock: Secondary | ICD-10-CM | POA: Diagnosis not present

## 2021-02-08 DIAGNOSIS — J9601 Acute respiratory failure with hypoxia: Secondary | ICD-10-CM | POA: Diagnosis not present

## 2021-02-08 DIAGNOSIS — I671 Cerebral aneurysm, nonruptured: Secondary | ICD-10-CM | POA: Diagnosis not present

## 2021-02-08 DIAGNOSIS — J9692 Respiratory failure, unspecified with hypercapnia: Secondary | ICD-10-CM | POA: Diagnosis not present

## 2021-02-08 DIAGNOSIS — R4182 Altered mental status, unspecified: Secondary | ICD-10-CM | POA: Diagnosis not present

## 2021-02-08 DIAGNOSIS — N390 Urinary tract infection, site not specified: Secondary | ICD-10-CM | POA: Diagnosis not present

## 2021-02-08 DIAGNOSIS — Z4682 Encounter for fitting and adjustment of non-vascular catheter: Secondary | ICD-10-CM | POA: Diagnosis not present

## 2021-02-08 DIAGNOSIS — K5730 Diverticulosis of large intestine without perforation or abscess without bleeding: Secondary | ICD-10-CM | POA: Diagnosis not present

## 2021-02-08 DIAGNOSIS — E669 Obesity, unspecified: Secondary | ICD-10-CM | POA: Diagnosis not present

## 2021-02-09 DIAGNOSIS — J9601 Acute respiratory failure with hypoxia: Secondary | ICD-10-CM | POA: Diagnosis not present

## 2021-02-09 DIAGNOSIS — N209 Urinary calculus, unspecified: Secondary | ICD-10-CM | POA: Diagnosis not present

## 2021-02-09 DIAGNOSIS — R6521 Severe sepsis with septic shock: Secondary | ICD-10-CM | POA: Diagnosis not present

## 2021-02-09 DIAGNOSIS — A419 Sepsis, unspecified organism: Secondary | ICD-10-CM | POA: Diagnosis not present

## 2021-02-09 DIAGNOSIS — E669 Obesity, unspecified: Secondary | ICD-10-CM | POA: Diagnosis not present

## 2021-02-09 DIAGNOSIS — I671 Cerebral aneurysm, nonruptured: Secondary | ICD-10-CM | POA: Diagnosis not present

## 2021-02-09 DIAGNOSIS — R7989 Other specified abnormal findings of blood chemistry: Secondary | ICD-10-CM | POA: Diagnosis not present

## 2021-02-09 DIAGNOSIS — N390 Urinary tract infection, site not specified: Secondary | ICD-10-CM | POA: Diagnosis not present

## 2021-02-09 DIAGNOSIS — J9692 Respiratory failure, unspecified with hypercapnia: Secondary | ICD-10-CM | POA: Diagnosis not present

## 2021-02-09 DIAGNOSIS — N138 Other obstructive and reflux uropathy: Secondary | ICD-10-CM | POA: Diagnosis not present

## 2021-02-10 DIAGNOSIS — R6521 Severe sepsis with septic shock: Secondary | ICD-10-CM | POA: Diagnosis not present

## 2021-02-10 DIAGNOSIS — N209 Urinary calculus, unspecified: Secondary | ICD-10-CM | POA: Diagnosis not present

## 2021-02-10 DIAGNOSIS — J9601 Acute respiratory failure with hypoxia: Secondary | ICD-10-CM | POA: Diagnosis not present

## 2021-02-10 DIAGNOSIS — A419 Sepsis, unspecified organism: Secondary | ICD-10-CM | POA: Diagnosis not present

## 2021-02-10 DIAGNOSIS — I671 Cerebral aneurysm, nonruptured: Secondary | ICD-10-CM | POA: Diagnosis not present

## 2021-02-10 DIAGNOSIS — N138 Other obstructive and reflux uropathy: Secondary | ICD-10-CM | POA: Diagnosis not present

## 2021-02-10 DIAGNOSIS — E669 Obesity, unspecified: Secondary | ICD-10-CM | POA: Diagnosis not present

## 2021-02-10 DIAGNOSIS — N390 Urinary tract infection, site not specified: Secondary | ICD-10-CM | POA: Diagnosis not present

## 2021-02-10 DIAGNOSIS — J9692 Respiratory failure, unspecified with hypercapnia: Secondary | ICD-10-CM | POA: Diagnosis not present

## 2021-02-11 DIAGNOSIS — R6521 Severe sepsis with septic shock: Secondary | ICD-10-CM | POA: Diagnosis not present

## 2021-02-11 DIAGNOSIS — J9601 Acute respiratory failure with hypoxia: Secondary | ICD-10-CM | POA: Diagnosis not present

## 2021-02-11 DIAGNOSIS — N390 Urinary tract infection, site not specified: Secondary | ICD-10-CM | POA: Diagnosis not present

## 2021-02-11 DIAGNOSIS — N209 Urinary calculus, unspecified: Secondary | ICD-10-CM | POA: Diagnosis not present

## 2021-02-11 DIAGNOSIS — N138 Other obstructive and reflux uropathy: Secondary | ICD-10-CM | POA: Diagnosis not present

## 2021-02-11 DIAGNOSIS — J9692 Respiratory failure, unspecified with hypercapnia: Secondary | ICD-10-CM | POA: Diagnosis not present

## 2021-02-11 DIAGNOSIS — I671 Cerebral aneurysm, nonruptured: Secondary | ICD-10-CM | POA: Diagnosis not present

## 2021-02-11 DIAGNOSIS — A419 Sepsis, unspecified organism: Secondary | ICD-10-CM | POA: Diagnosis not present

## 2021-02-11 DIAGNOSIS — E669 Obesity, unspecified: Secondary | ICD-10-CM | POA: Diagnosis not present

## 2021-02-12 DIAGNOSIS — I671 Cerebral aneurysm, nonruptured: Secondary | ICD-10-CM | POA: Diagnosis not present

## 2021-02-12 DIAGNOSIS — J9601 Acute respiratory failure with hypoxia: Secondary | ICD-10-CM | POA: Diagnosis not present

## 2021-02-12 DIAGNOSIS — N209 Urinary calculus, unspecified: Secondary | ICD-10-CM | POA: Diagnosis not present

## 2021-02-12 DIAGNOSIS — N138 Other obstructive and reflux uropathy: Secondary | ICD-10-CM | POA: Diagnosis not present

## 2021-02-12 DIAGNOSIS — A419 Sepsis, unspecified organism: Secondary | ICD-10-CM | POA: Diagnosis not present

## 2021-02-12 DIAGNOSIS — R6521 Severe sepsis with septic shock: Secondary | ICD-10-CM | POA: Diagnosis not present

## 2021-02-12 DIAGNOSIS — N390 Urinary tract infection, site not specified: Secondary | ICD-10-CM | POA: Diagnosis not present

## 2021-02-12 DIAGNOSIS — E669 Obesity, unspecified: Secondary | ICD-10-CM | POA: Diagnosis not present

## 2021-02-12 DIAGNOSIS — J9692 Respiratory failure, unspecified with hypercapnia: Secondary | ICD-10-CM | POA: Diagnosis not present

## 2021-02-13 DIAGNOSIS — R6521 Severe sepsis with septic shock: Secondary | ICD-10-CM | POA: Diagnosis not present

## 2021-02-13 DIAGNOSIS — Z4682 Encounter for fitting and adjustment of non-vascular catheter: Secondary | ICD-10-CM | POA: Diagnosis not present

## 2021-02-13 DIAGNOSIS — A419 Sepsis, unspecified organism: Secondary | ICD-10-CM | POA: Diagnosis not present

## 2021-02-13 DIAGNOSIS — J9811 Atelectasis: Secondary | ICD-10-CM | POA: Diagnosis not present

## 2021-02-14 DIAGNOSIS — R6521 Severe sepsis with septic shock: Secondary | ICD-10-CM | POA: Diagnosis not present

## 2021-02-14 DIAGNOSIS — A419 Sepsis, unspecified organism: Secondary | ICD-10-CM | POA: Diagnosis not present

## 2021-02-15 DIAGNOSIS — A419 Sepsis, unspecified organism: Secondary | ICD-10-CM | POA: Diagnosis not present

## 2021-02-15 DIAGNOSIS — F10231 Alcohol dependence with withdrawal delirium: Secondary | ICD-10-CM | POA: Diagnosis not present

## 2021-02-15 DIAGNOSIS — N139 Obstructive and reflux uropathy, unspecified: Secondary | ICD-10-CM | POA: Diagnosis not present

## 2021-02-15 DIAGNOSIS — R6521 Severe sepsis with septic shock: Secondary | ICD-10-CM | POA: Diagnosis not present

## 2021-02-16 DIAGNOSIS — A419 Sepsis, unspecified organism: Secondary | ICD-10-CM | POA: Diagnosis not present

## 2021-02-16 DIAGNOSIS — G4733 Obstructive sleep apnea (adult) (pediatric): Secondary | ICD-10-CM | POA: Diagnosis not present

## 2021-02-16 DIAGNOSIS — R6521 Severe sepsis with septic shock: Secondary | ICD-10-CM | POA: Diagnosis not present

## 2021-02-16 DIAGNOSIS — F10231 Alcohol dependence with withdrawal delirium: Secondary | ICD-10-CM | POA: Diagnosis not present

## 2021-02-16 DIAGNOSIS — N139 Obstructive and reflux uropathy, unspecified: Secondary | ICD-10-CM | POA: Diagnosis not present

## 2021-02-17 DIAGNOSIS — F10230 Alcohol dependence with withdrawal, uncomplicated: Secondary | ICD-10-CM | POA: Diagnosis not present

## 2021-02-17 DIAGNOSIS — G9341 Metabolic encephalopathy: Secondary | ICD-10-CM | POA: Diagnosis not present

## 2021-02-17 DIAGNOSIS — A419 Sepsis, unspecified organism: Secondary | ICD-10-CM | POA: Diagnosis not present

## 2021-02-17 DIAGNOSIS — J189 Pneumonia, unspecified organism: Secondary | ICD-10-CM | POA: Diagnosis not present

## 2021-02-17 DIAGNOSIS — Z515 Encounter for palliative care: Secondary | ICD-10-CM | POA: Diagnosis not present

## 2021-02-17 DIAGNOSIS — N179 Acute kidney failure, unspecified: Secondary | ICD-10-CM | POA: Diagnosis not present

## 2021-02-18 DIAGNOSIS — I509 Heart failure, unspecified: Secondary | ICD-10-CM | POA: Diagnosis not present

## 2021-02-18 DIAGNOSIS — R7401 Elevation of levels of liver transaminase levels: Secondary | ICD-10-CM | POA: Diagnosis not present

## 2021-02-18 DIAGNOSIS — J9601 Acute respiratory failure with hypoxia: Secondary | ICD-10-CM | POA: Diagnosis not present

## 2021-02-18 DIAGNOSIS — R6520 Severe sepsis without septic shock: Secondary | ICD-10-CM | POA: Diagnosis not present

## 2021-02-18 DIAGNOSIS — J9602 Acute respiratory failure with hypercapnia: Secondary | ICD-10-CM | POA: Diagnosis not present

## 2021-02-18 DIAGNOSIS — N139 Obstructive and reflux uropathy, unspecified: Secondary | ICD-10-CM | POA: Diagnosis not present

## 2021-02-18 DIAGNOSIS — N179 Acute kidney failure, unspecified: Secondary | ICD-10-CM | POA: Diagnosis not present

## 2021-02-18 DIAGNOSIS — A419 Sepsis, unspecified organism: Secondary | ICD-10-CM | POA: Diagnosis not present

## 2021-02-19 DIAGNOSIS — A419 Sepsis, unspecified organism: Secondary | ICD-10-CM | POA: Diagnosis not present

## 2021-02-19 DIAGNOSIS — I509 Heart failure, unspecified: Secondary | ICD-10-CM | POA: Diagnosis not present

## 2021-02-19 DIAGNOSIS — N179 Acute kidney failure, unspecified: Secondary | ICD-10-CM | POA: Diagnosis not present

## 2021-02-19 DIAGNOSIS — J9602 Acute respiratory failure with hypercapnia: Secondary | ICD-10-CM | POA: Diagnosis not present

## 2021-02-19 DIAGNOSIS — R6520 Severe sepsis without septic shock: Secondary | ICD-10-CM | POA: Diagnosis not present

## 2021-02-19 DIAGNOSIS — N139 Obstructive and reflux uropathy, unspecified: Secondary | ICD-10-CM | POA: Diagnosis not present

## 2021-02-19 DIAGNOSIS — R7401 Elevation of levels of liver transaminase levels: Secondary | ICD-10-CM | POA: Diagnosis not present

## 2021-02-19 DIAGNOSIS — J9601 Acute respiratory failure with hypoxia: Secondary | ICD-10-CM | POA: Diagnosis not present

## 2021-02-20 DIAGNOSIS — N139 Obstructive and reflux uropathy, unspecified: Secondary | ICD-10-CM | POA: Insufficient documentation

## 2021-02-20 DIAGNOSIS — J9601 Acute respiratory failure with hypoxia: Secondary | ICD-10-CM | POA: Diagnosis not present

## 2021-02-20 DIAGNOSIS — A419 Sepsis, unspecified organism: Secondary | ICD-10-CM | POA: Diagnosis not present

## 2021-02-20 DIAGNOSIS — I509 Heart failure, unspecified: Secondary | ICD-10-CM | POA: Diagnosis not present

## 2021-02-20 DIAGNOSIS — R7401 Elevation of levels of liver transaminase levels: Secondary | ICD-10-CM | POA: Diagnosis not present

## 2021-02-20 DIAGNOSIS — N179 Acute kidney failure, unspecified: Secondary | ICD-10-CM | POA: Diagnosis not present

## 2021-02-20 DIAGNOSIS — J9602 Acute respiratory failure with hypercapnia: Secondary | ICD-10-CM | POA: Diagnosis not present

## 2021-02-20 DIAGNOSIS — R6520 Severe sepsis without septic shock: Secondary | ICD-10-CM | POA: Diagnosis not present

## 2021-02-21 DIAGNOSIS — A419 Sepsis, unspecified organism: Secondary | ICD-10-CM | POA: Diagnosis not present

## 2021-02-21 DIAGNOSIS — I509 Heart failure, unspecified: Secondary | ICD-10-CM | POA: Diagnosis not present

## 2021-02-21 DIAGNOSIS — R6520 Severe sepsis without septic shock: Secondary | ICD-10-CM | POA: Diagnosis not present

## 2021-02-21 DIAGNOSIS — R7401 Elevation of levels of liver transaminase levels: Secondary | ICD-10-CM | POA: Diagnosis not present

## 2021-02-21 DIAGNOSIS — N179 Acute kidney failure, unspecified: Secondary | ICD-10-CM | POA: Diagnosis not present

## 2021-02-21 DIAGNOSIS — J9602 Acute respiratory failure with hypercapnia: Secondary | ICD-10-CM | POA: Diagnosis not present

## 2021-02-21 DIAGNOSIS — N139 Obstructive and reflux uropathy, unspecified: Secondary | ICD-10-CM | POA: Diagnosis not present

## 2021-02-21 DIAGNOSIS — J9601 Acute respiratory failure with hypoxia: Secondary | ICD-10-CM | POA: Diagnosis not present

## 2021-02-22 DIAGNOSIS — R7401 Elevation of levels of liver transaminase levels: Secondary | ICD-10-CM | POA: Diagnosis not present

## 2021-02-22 DIAGNOSIS — I509 Heart failure, unspecified: Secondary | ICD-10-CM | POA: Diagnosis not present

## 2021-02-22 DIAGNOSIS — N179 Acute kidney failure, unspecified: Secondary | ICD-10-CM | POA: Diagnosis not present

## 2021-02-22 DIAGNOSIS — A419 Sepsis, unspecified organism: Secondary | ICD-10-CM | POA: Diagnosis not present

## 2021-02-22 DIAGNOSIS — J9601 Acute respiratory failure with hypoxia: Secondary | ICD-10-CM | POA: Diagnosis not present

## 2021-02-22 DIAGNOSIS — J9602 Acute respiratory failure with hypercapnia: Secondary | ICD-10-CM | POA: Diagnosis not present

## 2021-02-22 DIAGNOSIS — N139 Obstructive and reflux uropathy, unspecified: Secondary | ICD-10-CM | POA: Diagnosis not present

## 2021-02-22 DIAGNOSIS — R6520 Severe sepsis without septic shock: Secondary | ICD-10-CM | POA: Diagnosis not present

## 2021-02-23 DIAGNOSIS — G9341 Metabolic encephalopathy: Secondary | ICD-10-CM | POA: Diagnosis not present

## 2021-02-23 DIAGNOSIS — A419 Sepsis, unspecified organism: Secondary | ICD-10-CM | POA: Diagnosis not present

## 2021-02-23 DIAGNOSIS — R1312 Dysphagia, oropharyngeal phase: Secondary | ICD-10-CM | POA: Diagnosis not present

## 2021-02-23 DIAGNOSIS — R7881 Bacteremia: Secondary | ICD-10-CM | POA: Diagnosis not present

## 2021-02-23 DIAGNOSIS — J9601 Acute respiratory failure with hypoxia: Secondary | ICD-10-CM | POA: Diagnosis not present

## 2021-02-23 DIAGNOSIS — J810 Acute pulmonary edema: Secondary | ICD-10-CM | POA: Diagnosis not present

## 2021-02-23 DIAGNOSIS — F10231 Alcohol dependence with withdrawal delirium: Secondary | ICD-10-CM | POA: Diagnosis not present

## 2021-02-23 DIAGNOSIS — N179 Acute kidney failure, unspecified: Secondary | ICD-10-CM | POA: Diagnosis not present

## 2021-02-23 DIAGNOSIS — N390 Urinary tract infection, site not specified: Secondary | ICD-10-CM | POA: Insufficient documentation

## 2021-02-23 DIAGNOSIS — B9620 Unspecified Escherichia coli [E. coli] as the cause of diseases classified elsewhere: Secondary | ICD-10-CM | POA: Insufficient documentation

## 2021-02-24 DIAGNOSIS — N390 Urinary tract infection, site not specified: Secondary | ICD-10-CM | POA: Diagnosis not present

## 2021-02-24 DIAGNOSIS — G9341 Metabolic encephalopathy: Secondary | ICD-10-CM | POA: Diagnosis not present

## 2021-02-24 DIAGNOSIS — R7881 Bacteremia: Secondary | ICD-10-CM | POA: Diagnosis not present

## 2021-02-24 DIAGNOSIS — J810 Acute pulmonary edema: Secondary | ICD-10-CM | POA: Diagnosis not present

## 2021-02-24 DIAGNOSIS — F10231 Alcohol dependence with withdrawal delirium: Secondary | ICD-10-CM | POA: Diagnosis not present

## 2021-02-24 DIAGNOSIS — A419 Sepsis, unspecified organism: Secondary | ICD-10-CM | POA: Diagnosis not present

## 2021-02-24 DIAGNOSIS — J9601 Acute respiratory failure with hypoxia: Secondary | ICD-10-CM | POA: Diagnosis not present

## 2021-02-24 DIAGNOSIS — R1312 Dysphagia, oropharyngeal phase: Secondary | ICD-10-CM | POA: Diagnosis not present

## 2021-02-24 DIAGNOSIS — N179 Acute kidney failure, unspecified: Secondary | ICD-10-CM | POA: Diagnosis not present

## 2021-02-25 DIAGNOSIS — R7881 Bacteremia: Secondary | ICD-10-CM | POA: Diagnosis not present

## 2021-02-25 DIAGNOSIS — J9601 Acute respiratory failure with hypoxia: Secondary | ICD-10-CM | POA: Diagnosis not present

## 2021-02-25 DIAGNOSIS — N179 Acute kidney failure, unspecified: Secondary | ICD-10-CM | POA: Diagnosis not present

## 2021-02-25 DIAGNOSIS — R1312 Dysphagia, oropharyngeal phase: Secondary | ICD-10-CM | POA: Diagnosis not present

## 2021-02-25 DIAGNOSIS — F10931 Alcohol use, unspecified with withdrawal delirium: Secondary | ICD-10-CM | POA: Diagnosis not present

## 2021-02-25 DIAGNOSIS — G9341 Metabolic encephalopathy: Secondary | ICD-10-CM | POA: Diagnosis not present

## 2021-02-25 DIAGNOSIS — A419 Sepsis, unspecified organism: Secondary | ICD-10-CM | POA: Diagnosis not present

## 2021-02-25 DIAGNOSIS — N390 Urinary tract infection, site not specified: Secondary | ICD-10-CM | POA: Diagnosis not present

## 2021-02-25 DIAGNOSIS — J810 Acute pulmonary edema: Secondary | ICD-10-CM | POA: Diagnosis not present

## 2021-03-07 DIAGNOSIS — N179 Acute kidney failure, unspecified: Secondary | ICD-10-CM | POA: Diagnosis not present

## 2021-03-07 DIAGNOSIS — A4151 Sepsis due to Escherichia coli [E. coli]: Secondary | ICD-10-CM | POA: Diagnosis not present

## 2021-03-07 DIAGNOSIS — K5900 Constipation, unspecified: Secondary | ICD-10-CM | POA: Diagnosis not present

## 2021-03-07 DIAGNOSIS — Z6834 Body mass index (BMI) 34.0-34.9, adult: Secondary | ICD-10-CM | POA: Diagnosis not present

## 2021-03-07 DIAGNOSIS — J9690 Respiratory failure, unspecified, unspecified whether with hypoxia or hypercapnia: Secondary | ICD-10-CM | POA: Diagnosis not present

## 2021-03-07 DIAGNOSIS — N200 Calculus of kidney: Secondary | ICD-10-CM | POA: Diagnosis not present

## 2021-03-07 DIAGNOSIS — I671 Cerebral aneurysm, nonruptured: Secondary | ICD-10-CM | POA: Diagnosis not present

## 2021-03-07 DIAGNOSIS — Z23 Encounter for immunization: Secondary | ICD-10-CM | POA: Diagnosis not present

## 2021-03-19 DIAGNOSIS — R4182 Altered mental status, unspecified: Secondary | ICD-10-CM | POA: Diagnosis not present

## 2021-03-19 DIAGNOSIS — I6523 Occlusion and stenosis of bilateral carotid arteries: Secondary | ICD-10-CM | POA: Diagnosis not present

## 2021-03-19 DIAGNOSIS — Z6833 Body mass index (BMI) 33.0-33.9, adult: Secondary | ICD-10-CM | POA: Diagnosis not present

## 2021-03-19 DIAGNOSIS — I639 Cerebral infarction, unspecified: Secondary | ICD-10-CM | POA: Insufficient documentation

## 2021-03-19 DIAGNOSIS — I082 Rheumatic disorders of both aortic and tricuspid valves: Secondary | ICD-10-CM | POA: Diagnosis not present

## 2021-03-19 DIAGNOSIS — H5461 Unqualified visual loss, right eye, normal vision left eye: Secondary | ICD-10-CM | POA: Diagnosis not present

## 2021-03-19 DIAGNOSIS — Z8673 Personal history of transient ischemic attack (TIA), and cerebral infarction without residual deficits: Secondary | ICD-10-CM | POA: Diagnosis not present

## 2021-03-19 DIAGNOSIS — Z713 Dietary counseling and surveillance: Secondary | ICD-10-CM | POA: Diagnosis not present

## 2021-03-19 DIAGNOSIS — I63233 Cerebral infarction due to unspecified occlusion or stenosis of bilateral carotid arteries: Secondary | ICD-10-CM | POA: Diagnosis not present

## 2021-03-19 DIAGNOSIS — E669 Obesity, unspecified: Secondary | ICD-10-CM | POA: Diagnosis not present

## 2021-03-19 DIAGNOSIS — H547 Unspecified visual loss: Secondary | ICD-10-CM | POA: Diagnosis not present

## 2021-03-19 DIAGNOSIS — I671 Cerebral aneurysm, nonruptured: Secondary | ICD-10-CM | POA: Diagnosis not present

## 2021-03-19 DIAGNOSIS — H53131 Sudden visual loss, right eye: Secondary | ICD-10-CM | POA: Diagnosis not present

## 2021-03-19 DIAGNOSIS — D689 Coagulation defect, unspecified: Secondary | ICD-10-CM | POA: Diagnosis not present

## 2021-03-19 DIAGNOSIS — G319 Degenerative disease of nervous system, unspecified: Secondary | ICD-10-CM | POA: Diagnosis not present

## 2021-03-19 DIAGNOSIS — I509 Heart failure, unspecified: Secondary | ICD-10-CM | POA: Diagnosis not present

## 2021-03-19 DIAGNOSIS — Z20822 Contact with and (suspected) exposure to covid-19: Secondary | ICD-10-CM | POA: Diagnosis not present

## 2021-03-20 DIAGNOSIS — H5461 Unqualified visual loss, right eye, normal vision left eye: Secondary | ICD-10-CM | POA: Diagnosis not present

## 2021-03-20 DIAGNOSIS — Z716 Tobacco abuse counseling: Secondary | ICD-10-CM | POA: Diagnosis not present

## 2021-03-20 DIAGNOSIS — E669 Obesity, unspecified: Secondary | ICD-10-CM | POA: Diagnosis not present

## 2021-03-20 DIAGNOSIS — I082 Rheumatic disorders of both aortic and tricuspid valves: Secondary | ICD-10-CM | POA: Diagnosis not present

## 2021-03-20 DIAGNOSIS — Z6832 Body mass index (BMI) 32.0-32.9, adult: Secondary | ICD-10-CM | POA: Diagnosis not present

## 2021-03-21 DIAGNOSIS — Z6832 Body mass index (BMI) 32.0-32.9, adult: Secondary | ICD-10-CM | POA: Diagnosis not present

## 2021-03-21 DIAGNOSIS — E669 Obesity, unspecified: Secondary | ICD-10-CM | POA: Diagnosis not present

## 2021-03-21 DIAGNOSIS — I639 Cerebral infarction, unspecified: Secondary | ICD-10-CM | POA: Diagnosis not present

## 2021-03-22 DIAGNOSIS — H18413 Arcus senilis, bilateral: Secondary | ICD-10-CM | POA: Diagnosis not present

## 2021-03-22 DIAGNOSIS — H2513 Age-related nuclear cataract, bilateral: Secondary | ICD-10-CM | POA: Diagnosis not present

## 2021-03-22 DIAGNOSIS — H34231 Retinal artery branch occlusion, right eye: Secondary | ICD-10-CM | POA: Diagnosis not present

## 2021-03-22 DIAGNOSIS — Z6832 Body mass index (BMI) 32.0-32.9, adult: Secondary | ICD-10-CM | POA: Diagnosis not present

## 2021-03-22 DIAGNOSIS — I671 Cerebral aneurysm, nonruptured: Secondary | ICD-10-CM | POA: Diagnosis not present

## 2021-03-22 DIAGNOSIS — H3582 Retinal ischemia: Secondary | ICD-10-CM | POA: Diagnosis not present

## 2021-03-22 DIAGNOSIS — I10 Essential (primary) hypertension: Secondary | ICD-10-CM | POA: Diagnosis not present

## 2021-03-23 ENCOUNTER — Other Ambulatory Visit: Payer: Self-pay | Admitting: Neurosurgery

## 2021-03-23 DIAGNOSIS — I700 Atherosclerosis of aorta: Secondary | ICD-10-CM | POA: Diagnosis not present

## 2021-03-23 DIAGNOSIS — K5790 Diverticulosis of intestine, part unspecified, without perforation or abscess without bleeding: Secondary | ICD-10-CM | POA: Diagnosis not present

## 2021-03-23 DIAGNOSIS — Z466 Encounter for fitting and adjustment of urinary device: Secondary | ICD-10-CM | POA: Diagnosis not present

## 2021-03-23 DIAGNOSIS — N200 Calculus of kidney: Secondary | ICD-10-CM | POA: Diagnosis not present

## 2021-03-23 DIAGNOSIS — I671 Cerebral aneurysm, nonruptured: Secondary | ICD-10-CM

## 2021-03-23 DIAGNOSIS — N10 Acute tubulo-interstitial nephritis: Secondary | ICD-10-CM | POA: Diagnosis not present

## 2021-03-23 DIAGNOSIS — N201 Calculus of ureter: Secondary | ICD-10-CM | POA: Diagnosis not present

## 2021-03-23 DIAGNOSIS — K5730 Diverticulosis of large intestine without perforation or abscess without bleeding: Secondary | ICD-10-CM | POA: Diagnosis not present

## 2021-03-23 DIAGNOSIS — R52 Pain, unspecified: Secondary | ICD-10-CM | POA: Diagnosis not present

## 2021-03-23 DIAGNOSIS — K7689 Other specified diseases of liver: Secondary | ICD-10-CM | POA: Diagnosis not present

## 2021-03-24 ENCOUNTER — Other Ambulatory Visit: Payer: Self-pay | Admitting: Neurosurgery

## 2021-03-27 DIAGNOSIS — N200 Calculus of kidney: Secondary | ICD-10-CM | POA: Diagnosis not present

## 2021-03-27 DIAGNOSIS — H53451 Other localized visual field defect, right eye: Secondary | ICD-10-CM | POA: Diagnosis not present

## 2021-03-27 DIAGNOSIS — I639 Cerebral infarction, unspecified: Secondary | ICD-10-CM | POA: Diagnosis not present

## 2021-03-27 DIAGNOSIS — N179 Acute kidney failure, unspecified: Secondary | ICD-10-CM | POA: Diagnosis not present

## 2021-03-27 DIAGNOSIS — J9690 Respiratory failure, unspecified, unspecified whether with hypoxia or hypercapnia: Secondary | ICD-10-CM | POA: Diagnosis not present

## 2021-03-27 DIAGNOSIS — A4151 Sepsis due to Escherichia coli [E. coli]: Secondary | ICD-10-CM | POA: Diagnosis not present

## 2021-03-27 DIAGNOSIS — K5900 Constipation, unspecified: Secondary | ICD-10-CM | POA: Diagnosis not present

## 2021-03-27 DIAGNOSIS — Z8679 Personal history of other diseases of the circulatory system: Secondary | ICD-10-CM | POA: Diagnosis not present

## 2021-03-27 DIAGNOSIS — I671 Cerebral aneurysm, nonruptured: Secondary | ICD-10-CM | POA: Diagnosis not present

## 2021-03-28 ENCOUNTER — Ambulatory Visit: Payer: Medicare HMO | Admitting: Urology

## 2021-03-30 ENCOUNTER — Other Ambulatory Visit: Payer: Self-pay

## 2021-03-30 ENCOUNTER — Encounter: Payer: Self-pay | Admitting: Urology

## 2021-03-30 ENCOUNTER — Ambulatory Visit (HOSPITAL_COMMUNITY)
Admission: RE | Admit: 2021-03-30 | Discharge: 2021-03-30 | Disposition: A | Discharge status: Home / Self Care | Payer: Medicare HMO | Source: Ambulatory Visit | Attending: Urology | Admitting: Urology

## 2021-03-30 ENCOUNTER — Ambulatory Visit: Payer: Medicare HMO | Admitting: Urology

## 2021-03-30 VITALS — BP 134/79 | HR 111 | Temp 97.7°F

## 2021-03-30 DIAGNOSIS — N471 Phimosis: Secondary | ICD-10-CM | POA: Insufficient documentation

## 2021-03-30 DIAGNOSIS — Z87440 Personal history of urinary (tract) infections: Secondary | ICD-10-CM | POA: Diagnosis not present

## 2021-03-30 DIAGNOSIS — N201 Calculus of ureter: Secondary | ICD-10-CM

## 2021-03-30 DIAGNOSIS — Z466 Encounter for fitting and adjustment of urinary device: Secondary | ICD-10-CM | POA: Diagnosis not present

## 2021-03-30 DIAGNOSIS — R8290 Unspecified abnormal findings in urine: Secondary | ICD-10-CM | POA: Diagnosis not present

## 2021-03-30 LAB — URINALYSIS, ROUTINE W REFLEX MICROSCOPIC
Bilirubin, UA: NEGATIVE
Glucose, UA: NEGATIVE
Ketones, UA: NEGATIVE
Nitrite, UA: NEGATIVE
Specific Gravity, UA: 1.02 (ref 1.005–1.030)
Urobilinogen, Ur: 0.2 mg/dL (ref 0.2–1.0)
pH, UA: 5.5 (ref 5.0–7.5)

## 2021-03-30 LAB — MICROSCOPIC EXAMINATION: Renal Epithel, UA: NONE SEEN /hpf

## 2021-03-30 MED ORDER — SULFAMETHOXAZOLE-TRIMETHOPRIM 800-160 MG PO TABS: 1.0000 | ORAL_TABLET | Freq: Two times a day (BID) | ORAL | 0 refills | Status: AC

## 2021-03-30 MED ORDER — NYSTATIN 100000 UNIT/GM EX OINT: 1.0000 "application " | TOPICAL_OINTMENT | Freq: Two times a day (BID) | CUTANEOUS | 1 refills | Status: AC

## 2021-03-30 NOTE — Progress Notes (Signed)
Urological Symptom Review  Patient is experiencing the following symptoms: Frequent urination Get up at night to urinate Urinary tract infection Injury to kidneys/bladder Weak stream   Review of Systems  Gastrointestinal (upper)  : Nausea Vomiting  Gastrointestinal (lower) : Constipation  Constitutional : Fever Fatigue  Skin: Negative for skin symptoms  Eyes: Blurred vision  Ear/Nose/Throat : Negative for Ear/Nose/Throat symptoms  Hematologic/Lymphatic: Negative for Hematologic/Lymphatic symptoms  Cardiovascular : Negative for cardiovascular symptoms  Respiratory : Cough  Endocrine: Negative for endocrine symptoms  Musculoskeletal: Negative for musculoskeletal symptoms  Neurological: Negative for neurological symptoms  Psychologic: Negative for psychiatric symptoms

## 2021-03-30 NOTE — Progress Notes (Signed)
Assessment: 1. Ureteral calculus, left   2. History of UTI   3. Phimosis   4. Abnormal urine findings      Plan: Urine culture today Bactrim DS BID x 5 days.  Rx sent. Discontinue Keflex due to GI effects Nystatin cream to penis KUB at [facility] Return to office in 1 week   I personally spent 45 minutes involved in face to face and non-face-to-face activities for this patient on the day of the visit.  Professional time spent included the following activities, in addition to those noted in the documentation: Review of his lengthy hospital chart related to his recent hospitalization for sepsis with obstructing left ureteral calculus, review of ER records, review of recent CT study from 03/23/2021, review of recent lab results, discussion of management options for left ureteral calculus  Chief Complaint:  Chief Complaint  Patient presents with   Nephrolithiasis   Urinary Tract Infection     History of Present Illness:  William Leonard is a 75 y.o. year old male who is seen in consultation from Consuello Masse, MD for evaluation of a left ureteral calculus with associated UTI.  He presented to the emergency room at [facility] in [place] on 01/30/2021 with fever to 105 and confusion.  He was found to have a UTI.  Urine culture grew E. coli.  Blood cultures were also positive for E. coli.  CT imaging showed a 5 mm obstructing calculus in the left proximal ureter with hydronephrosis.  The patient underwent cystoscopy with a left retrograde pyelogram and left ureteral stent placement.  He was admitted to the ICU for septic shock and AKI.  He was eventually discharged from the hospital on 02/25/2021.  Since discharge he was again hospitalized on 03/19/2021 for a CVA.  He presented to the emergency room in [employer] on 03/23/2021 with fever.  CT imaging showed the left ureteral stent in good position with a 4 mm stone alongside the stent in the distal left ureter.  Urine culture  grew >100 K E. coli, pansensitive.  He has continued on cephalexin 500 mg 3 times daily.  He has not having any flank pain.  No recent fevers or chills.  No dysuria or gross hematuria. He does report difficulty with retraction of the foreskin which began recently.   Past Medical History:  Past Medical History:  Diagnosis Date   BPH with urinary obstruction    Diabetes mellitus without complication ([place])    Hyperlipidemia    Stroke [facility])     Past Surgical History:  Past Surgical History:  Procedure Laterality Date   CYSTOSCOPY      Allergies:  Allergies  Allergen Reactions   Other Other (See Comments)    Blue cheese Blue cheese    Shellfish Allergy Other (See Comments)    mussels    Family History:  History reviewed. No pertinent family history.  Social History:  Social History   Tobacco Use   Smoking status: Never   Smokeless tobacco: Never    Review of symptoms:  Constitutional:  Negative for unexplained weight loss, night sweats, fever, chills ENT:  Negative for nose bleeds, sinus pain, painful swallowing CV:  Negative for chest pain, shortness of breath, exercise intolerance, palpitations, loss of consciousness Resp:  Negative for cough, wheezing, shortness of breath GI:  Negative for nausea, vomiting, diarrhea, bloody stools GU:  Positives noted in HPI; otherwise negative for gross hematuria, dysuria, urinary incontinence Neuro:  Negative for seizures, poor  balance, limb weakness, slurred speech Psych:  Negative for lack of energy, depression, anxiety Endocrine:  Negative for polydipsia, polyuria, symptoms of hypoglycemia (dizziness, hunger, sweating) Hematologic:  Negative for anemia, purpura, petechia, prolonged or excessive bleeding, use of anticoagulants  Allergic:  Negative for difficulty breathing or choking as a result of exposure to anything; no shellfish allergy; no allergic response (rash/itch) to materials, foods  Physical exam: BP 134/79   Pulse  (!) 111   Temp 97.7 F (36.5 C)  GENERAL APPEARANCE:  Well appearing, well developed, well nourished, NAD HEENT: Atraumatic, Normocephalic, oropharynx clear. NECK: Supple without lymphadenopathy or thyromegaly. LUNGS: Clear to auscultation bilaterally. HEART: Regular Rate and Rhythm without murmurs, gallops, or rubs. ABDOMEN: Soft, non-tender, No Masses. EXTREMITIES: Moves all extremities well.  Without clubbing, cyanosis, or edema. NEUROLOGIC:  Alert and oriented x 3, normal gait, CN II-XII grossly intact.  MENTAL STATUS:  Appropriate. BACK:  Non-tender to palpation.  No CVAT SKIN:  Warm, dry and intact.   GU: Penis: Uncircumcised, phimosis, erythema of distal foreskin consistent with Candida balanitis Meatus: Normal Scrotum: normal, no masses   Results: U/A:  6-10 WBC, 11-30 RBC, few bacteria

## 2021-03-31 ENCOUNTER — Ambulatory Visit: Payer: Medicare HMO | Admitting: Urology

## 2021-04-01 LAB — URINE CULTURE

## 2021-04-03 ENCOUNTER — Telehealth: Payer: Self-pay

## 2021-04-03 NOTE — Telephone Encounter (Signed)
Patient called and made aware.

## 2021-04-03 NOTE — Telephone Encounter (Signed)
-----   Message from Primus Bravo, MD sent at 04/02/2021 10:21 PM EST ----- Please notify patient that his urine culture does not show a definite UTI.  Recommend completing Bactrim as prescribed.

## 2021-04-04 ENCOUNTER — Other Ambulatory Visit: Payer: Self-pay

## 2021-04-04 ENCOUNTER — Ambulatory Visit (HOSPITAL_COMMUNITY)
Admission: RE | Admit: 2021-04-04 | Discharge: 2021-04-04 | Disposition: A | Discharge status: Home / Self Care | Payer: Medicare HMO | Source: Ambulatory Visit | Attending: Neurosurgery | Admitting: Neurosurgery

## 2021-04-04 ENCOUNTER — Other Ambulatory Visit: Payer: Self-pay | Admitting: Neurosurgery

## 2021-04-04 DIAGNOSIS — I671 Cerebral aneurysm, nonruptured: Secondary | ICD-10-CM

## 2021-04-04 DIAGNOSIS — R930 Abnormal findings on diagnostic imaging of skull and head, not elsewhere classified: Secondary | ICD-10-CM | POA: Diagnosis not present

## 2021-04-04 HISTORY — PX: IR 3D INDEPENDENT WKST: IMG2385

## 2021-04-04 HISTORY — PX: IR ANGIO INTRA EXTRACRAN SEL INTERNAL CAROTID BILAT MOD SED: IMG5363

## 2021-04-04 HISTORY — PX: IR ANGIO VERTEBRAL SEL VERTEBRAL UNI L MOD SED: IMG5367

## 2021-04-04 LAB — CBC WITH DIFFERENTIAL/PLATELET
Abs Immature Granulocytes: 0.1 10*3/uL — ABNORMAL HIGH (ref 0.00–0.07)
Basophils Absolute: 0.1 10*3/uL (ref 0.0–0.1)
Basophils Relative: 1 %
Eosinophils Absolute: 0.2 10*3/uL (ref 0.0–0.5)
Eosinophils Relative: 1 %
HCT: 27.3 % — ABNORMAL LOW (ref 39.0–52.0)
Hemoglobin: 8.6 g/dL — ABNORMAL LOW (ref 13.0–17.0)
Immature Granulocytes: 1 %
Lymphocytes Relative: 12 %
Lymphs Abs: 1.5 10*3/uL (ref 0.7–4.0)
MCH: 27.6 pg (ref 26.0–34.0)
MCHC: 31.5 g/dL (ref 30.0–36.0)
MCV: 87.5 fL (ref 80.0–100.0)
Monocytes Absolute: 0.8 10*3/uL (ref 0.1–1.0)
Monocytes Relative: 6 %
Neutro Abs: 10.5 10*3/uL — ABNORMAL HIGH (ref 1.7–7.7)
Neutrophils Relative %: 79 %
Platelets: 405 10*3/uL — ABNORMAL HIGH (ref 150–400)
RBC: 3.12 MIL/uL — ABNORMAL LOW (ref 4.22–5.81)
RDW: 14.5 % (ref 11.5–15.5)
WBC: 13.2 10*3/uL — ABNORMAL HIGH (ref 4.0–10.5)
nRBC: 0 % (ref 0.0–0.2)

## 2021-04-04 LAB — BASIC METABOLIC PANEL
Anion gap: 11 (ref 5–15)
BUN: 34 mg/dL — ABNORMAL HIGH (ref 8–23)
CO2: 24 mmol/L (ref 22–32)
Calcium: 9.5 mg/dL (ref 8.9–10.3)
Chloride: 97 mmol/L — ABNORMAL LOW (ref 98–111)
Creatinine, Ser: 1.24 mg/dL (ref 0.61–1.24)
GFR, Estimated: 60 mL/min — ABNORMAL LOW (ref >60)
Glucose, Bld: 118 mg/dL — ABNORMAL HIGH (ref 70–99)
Potassium: 4.3 mmol/L (ref 3.5–5.1)
Sodium: 132 mmol/L — ABNORMAL LOW (ref 135–145)

## 2021-04-04 LAB — PROTIME-INR
INR: 1.1 (ref 0.8–1.2)
Prothrombin Time: 14.2 seconds (ref 11.4–15.2)

## 2021-04-04 LAB — APTT: aPTT: 30 seconds (ref 24–36)

## 2021-04-04 MED ORDER — LIDOCAINE HCL 1 % IJ SOLN
INTRAMUSCULAR | Status: AC
  Filled 2021-04-04: qty 20

## 2021-04-04 MED ORDER — MIDAZOLAM HCL 2 MG/2ML IJ SOLN
INTRAMUSCULAR | Status: AC | PRN
  Administered 2021-04-04: 1 mg via INTRAVENOUS

## 2021-04-04 MED ORDER — SODIUM CHLORIDE 0.9 % IV SOLN: INTRAVENOUS | Status: DC

## 2021-04-04 MED ORDER — FENTANYL CITRATE (PF) 100 MCG/2ML IJ SOLN
INTRAMUSCULAR | Status: AC
  Filled 2021-04-04: qty 2

## 2021-04-04 MED ORDER — HEPARIN SODIUM (PORCINE) 1000 UNIT/ML IJ SOLN
INTRAMUSCULAR | Status: AC | PRN
  Administered 2021-04-04: 2000 [IU] via INTRAVENOUS

## 2021-04-04 MED ORDER — HEPARIN SODIUM (PORCINE) 1000 UNIT/ML IJ SOLN
INTRAMUSCULAR | Status: AC
  Filled 2021-04-04: qty 1

## 2021-04-04 MED ORDER — CEFAZOLIN SODIUM-DEXTROSE 2-4 GM/100ML-% IV SOLN: 2.0000 g | INTRAVENOUS | Status: DC

## 2021-04-04 MED ORDER — HYDROCODONE-ACETAMINOPHEN 5-325 MG PO TABS: 1.0000 | ORAL_TABLET | ORAL | Status: DC | PRN

## 2021-04-04 MED ORDER — MIDAZOLAM HCL 2 MG/2ML IJ SOLN
INTRAMUSCULAR | Status: AC
  Filled 2021-04-04: qty 2

## 2021-04-04 MED ORDER — SODIUM CHLORIDE 0.9 % IV BOLUS: 500.0000 mL | Freq: Once | INTRAVENOUS | Status: DC

## 2021-04-04 MED ORDER — CHLORHEXIDINE GLUCONATE CLOTH 2 % EX PADS: 6.0000 | MEDICATED_PAD | Freq: Once | CUTANEOUS | Status: DC

## 2021-04-04 MED ORDER — IOHEXOL 300 MG/ML  SOLN
150.0000 mL | Freq: Once | INTRAMUSCULAR | Status: AC | PRN
  Administered 2021-04-04: 100 mL via INTRA_ARTERIAL

## 2021-04-04 MED ORDER — FENTANYL CITRATE (PF) 100 MCG/2ML IJ SOLN
INTRAMUSCULAR | Status: AC | PRN
  Administered 2021-04-04: 25 ug via INTRAVENOUS

## 2021-04-04 NOTE — H&P (Signed)
  Chief Complaint  Aneurysm  History of Present Illness  William Leonard is a 75 y.o. male initially seen in the outpatient neurosurgery clinic after incidental discovery of intracranial aneurysm.  Briefly, patient was in [place] a few months ago and was admitted to the hospital with altered mental status due to urosepsis.  His work-up included MRI which incidentally revealed a right middle cerebral artery aneurysm.  He therefore presents today for further work-up with a diagnostic cerebral angiogram.  Past Medical History   Past Medical History:  Diagnosis Date   BPH with urinary obstruction    Diabetes mellitus without complication ([place])    Hyperlipidemia    Stroke [facility])     Past Surgical History   Past Surgical History:  Procedure Laterality Date   CYSTOSCOPY      Social History   Social History   Tobacco Use   Smoking status: Never   Smokeless tobacco: Never    Medications   Prior to Admission medications   Medication Sig Start Date End Date Taking? Authorizing Provider  aspirin 325 MG EC tablet Take 1 tablet by mouth daily. 03/22/21 03/22/22 Yes [provider]  atorvastatin (LIPITOR) 40 MG tablet Take 1 tablet by mouth daily. 03/22/21 04/21/21 Yes [provider]  lisinopril (ZESTRIL) 5 MG tablet    Yes [provider]  nystatin ointment (MYCOSTATIN) Apply 1 application topically 2 (two) times daily. 03/30/21  Yes Stoneking, Reece Leader., MD  senna (SENOKOT) 8.6 MG tablet Take by mouth. 02/25/21  Yes [provider]  sulfamethoxazole-trimethoprim (BACTRIM DS) 800-160 MG tablet Take 1 tablet by mouth every 12 (twelve) hours for 5 days. 03/30/21 04/04/21 Yes Stoneking, Reece Leader., MD  tamsulosin (FLOMAX) 0.4 MG CAPS capsule Take by mouth.   Yes [provider]  thiamine 100 MG tablet Take 2 tablets by mouth daily. 02/26/21  Yes [provider]  triamcinolone cream (KENALOG) 0.1 %  02/06/21  Yes [provider]  ezetimibe (ZETIA) 10 MG tablet Take by mouth.    [provider]    Allergies   Allergies  Allergen Reactions   Other Other (See Comments)    Blue cheese Blue cheese    Shellfish Allergy Other (See Comments)    mussels    Review of Systems  ROS  Neurologic Exam  Awake, alert, oriented Memory and concentration grossly intact Speech fluent, appropriate CN grossly intact Motor exam: Upper Extremities Deltoid Bicep Tricep Grip  Right 5/5 5/5 5/5 5/5  Left 5/5 5/5 5/5 5/5   Lower Extremities IP Quad PF DF EHL  Right 5/5 5/5 5/5 5/5 5/5  Left 5/5 5/5 5/5 5/5 5/5   Sensation grossly intact to LT  Imaging  MRI of the brain dated 03/20/2021 was reviewed.  T2 images appear to demonstrate a multilobulated approximately 8 mm right middle cerebral artery aneurysm.  Impression  - 75 y.o. male with incidental discovery of 8 mm right MCA aneurysm  Plan  -We will plan on proceeding with diagnostic cerebral angiogram  I have reviewed the imaging findings with the patient.  We have discussed the details of the angiogram procedure as well as the expected postoperative course and recovery.  We have discussed the associated risks, benefits, and alternatives.  All his questions today were answered.  He provided informed consent to proceed.   Consuella Lose, MD [facility]

## 2021-04-04 NOTE — Brief Op Note (Signed)
  NEUROSURGERY BRIEF OPERATIVE  NOTE   PREOP DX: Right MCA aneurysm  POSTOP DX: Same  PROCEDURE: Diagnostic cerebral angiogram  SURGEON: Dr. Consuella Lose, MD  ANESTHESIA: IV Sedation with Local  EBL: Minimal  SPECIMENS: None  COMPLICATIONS: None  CONDITION: Stable to recovery  FINDINGS (Full report in [facility]): 1. 7.60mm Right MCA aneurysm 2. No other aneurysms, AVM, or fistula seen   Consuella Lose, MD [facility]

## 2021-04-04 NOTE — Progress Notes (Signed)
Pt ambulated without difficulty or bleeding.   Discharged home with his wife who will drive and stay with pt x 24 hrs. 

## 2021-04-04 NOTE — Progress Notes (Signed)
On arrival post angiogram report was given stating pt bp has been soft 100/48. Per Armen Pickup RN/ radiology, pt was placed on NS 21ml/hr and encouraged to drink. Bp continued to lower, Right groin level 0, no pain, pt other wise feeling well and denies dizziness. Dr Kathyrn Sheriff was called and order was given for a fluid bolus, pt remains feeling well,

## 2021-04-04 NOTE — Sedation Documentation (Signed)
5 fr exoseal deployed at [employer]

## 2021-04-06 ENCOUNTER — Encounter: Payer: Self-pay | Admitting: Urology

## 2021-04-06 ENCOUNTER — Other Ambulatory Visit: Payer: Self-pay

## 2021-04-06 ENCOUNTER — Ambulatory Visit: Payer: Medicare HMO | Admitting: Urology

## 2021-04-06 VITALS — BP 98/65 | HR 89 | Temp 98.1°F

## 2021-04-06 DIAGNOSIS — Z87440 Personal history of urinary (tract) infections: Secondary | ICD-10-CM | POA: Diagnosis not present

## 2021-04-06 DIAGNOSIS — N201 Calculus of ureter: Secondary | ICD-10-CM | POA: Diagnosis not present

## 2021-04-06 DIAGNOSIS — N471 Phimosis: Secondary | ICD-10-CM | POA: Diagnosis not present

## 2021-04-06 LAB — URINALYSIS, ROUTINE W REFLEX MICROSCOPIC
Bilirubin, UA: NEGATIVE
Glucose, UA: NEGATIVE
Ketones, UA: NEGATIVE
Nitrite, UA: NEGATIVE
Specific Gravity, UA: 1.02 (ref 1.005–1.030)
Urobilinogen, Ur: 0.2 mg/dL (ref 0.2–1.0)
pH, UA: 7 (ref 5.0–7.5)

## 2021-04-06 LAB — MICROSCOPIC EXAMINATION
RBC, Urine: >30 /hpf — AB (ref 0–2)
Renal Epithel, UA: NONE SEEN /hpf

## 2021-04-06 MED ORDER — NITROFURANTOIN MONOHYD MACRO 100 MG PO CAPS: 100.0000 mg | ORAL_CAPSULE | Freq: Every day | ORAL | 0 refills | Status: AC

## 2021-04-06 NOTE — Progress Notes (Signed)
Urological Symptom Review  Patient is experiencing the following symptoms: Get up at night to urinate Weak stream   Review of Systems  Gastrointestinal (upper)  : Negative for upper GI symptoms  Gastrointestinal (lower) : Negative for lower GI symptoms  Constitutional : Fatigue  Skin: Negative for skin symptoms  Eyes: Negative for eye symptoms  Ear/Nose/Throat : Negative for Ear/Nose/Throat symptoms  Hematologic/Lymphatic: Negative for Hematologic/Lymphatic symptoms  Cardiovascular : Negative for cardiovascular symptoms  Respiratory : Negative for respiratory symptoms  Endocrine: Negative for endocrine symptoms  Musculoskeletal: Negative for musculoskeletal symptoms  Neurological: Negative for neurological symptoms  Psychologic: Negative for psychiatric symptoms

## 2021-04-06 NOTE — Progress Notes (Signed)
Assessment: 1. Ureteral calculus, left   2. Phimosis   3. History of UTI      Plan: I reviewed the KUB from 03/30/2021 showing a distal left ureteral calculus alongside the left ureteral stent. Options for management of the distal left ureteral calculus discussed including removal and spontaneous passage, shockwave lithotripsy, and ureteroscopic stone manipulation with stent exchange. He would like to proceed with ureteroscopic stone manipulation and stent exchange. Continued Nystatin cream to penis Begin daily Macrobid   Procedure: The patient will be scheduled for cystoscopy, bilateral retrograde pyelograms, left ureteroscopy, laser lithotripsy, insertion of left ureteral stent at [facility].  Surgical request is placed with the surgery schedulers and will be scheduled at the patient's/family request. Informed consent is given as documented below. Anesthesia:  choice  The patient does not have sleep apnea, history of MRSA, history of VRE, history of cardiac device requiring special anesthetic needs. Patient is stable and considered clear for surgical in an outpatient ambulatory surgery setting as well as patient hospital setting.  Consent for Operation or Procedure: Provider Certification I hereby certify that the nature, purpose, benefits, usual and most frequent risks of, and alternatives to, the operation or procedure have been explained to the patient (or person authorized to sign for the patient) either by me as responsible physician or by the provider who is to perform the operation or procedure. Time spent such that the patient/family has had an opportunity to ask questions, and that those questions have been answered. The patient or the patient's representative has been advised that selected tasks may be performed by assistants to the primary health care provider(s). I believe that the patient (or person authorized to sign for the patient) understands what has been explained, and  has consented to the operation or procedure. No guarantees were implied or made.    Chief Complaint:  Chief Complaint  Patient presents with   ureteral calculus    History of Present Illness:  William Leonard is a 75 y.o. year old male who is seen  for further evaluation of a left ureteral calculus with associated UTI.  He presented to the emergency room at [facility] in [place] on 01/30/2021 with fever to 105 and confusion.  He was found to have a UTI.  Urine culture grew E. coli.  Blood cultures were also positive for E. coli.  CT imaging showed a 5 mm obstructing calculus in the left proximal ureter with hydronephrosis.  The patient underwent cystoscopy with a left retrograde pyelogram and left ureteral stent placement.  He was admitted to the ICU for septic shock and AKI.  He was eventually discharged from the hospital on 02/25/2021.  Since discharge he was again hospitalized on 03/19/2021 for a CVA.  He presented to the emergency room in [place] on 03/23/2021 with fever.  CT imaging showed the left ureteral stent in good position with a 4 mm stone alongside the stent in the distal left ureter.  Urine culture grew >100 K E. coli, pansensitive.  He has continued on cephalexin 500 mg 3 times daily.  He has not having any flank pain.  No recent fevers or chills.  No dysuria or gross hematuria. He does report difficulty with retraction of the foreskin which began recently.  Urine culture from 03/30/2021 showed no evidence of infection. KUB from 03/31/2021 showed a persistent 5 mm calculus adjacent to the distal aspect of the left ureteral stent.   He returns today for follow-up.  No  flank pain.  No fevers or chills.  He does have some urinary symptoms related to the stent.  No dysuria or gross hematuria.  He has completed his antibiotics.  Portions of the above documentation were copied from a prior visit for review purposes only.   Past Medical History:  Past Medical History:   Diagnosis Date   BPH with urinary obstruction    Diabetes mellitus without complication ([place])    Hyperlipidemia    Stroke [facility])     Past Surgical History:  Past Surgical History:  Procedure Laterality Date   CYSTOSCOPY     IR 3D INDEPENDENT WKST  04/04/2021   IR ANGIO INTRA EXTRACRAN SEL INTERNAL CAROTID BILAT MOD SED  04/04/2021   IR ANGIO VERTEBRAL SEL VERTEBRAL UNI L MOD SED  04/04/2021    Allergies:  Allergies  Allergen Reactions   Other Other (See Comments)    Blue cheese Blue cheese    Shellfish Allergy Other (See Comments)    mussels Other reaction(s): Other (See Comments) mussels    Family History:  No family history on file.  Social History:  Social History   Tobacco Use   Smoking status: Never   Smokeless tobacco: Never    ROS: Constitutional:  Negative for fever, chills, weight loss CV: Negative for chest pain, previous MI, hypertension Respiratory:  Negative for shortness of breath, wheezing, sleep apnea, frequent cough GI:  Negative for nausea, vomiting, bloody stool, GERD  Physical exam: BP 98/65   Pulse 89   Temp 98.1 F (36.7 C)  GENERAL APPEARANCE:  Well appearing, well developed, well nourished, NAD HEENT: Atraumatic, Normocephalic, oropharynx clear. NECK: Supple without lymphadenopathy or thyromegaly. LUNGS: Clear to auscultation bilaterally. HEART: Regular Rate and Rhythm without murmurs, gallops, or rubs. ABDOMEN: Soft, non-tender, No Masses. EXTREMITIES: Moves all extremities well.  Without clubbing, cyanosis, or edema. NEUROLOGIC:  Alert and oriented x 3, normal gait, CN II-XII grossly intact.  MENTAL STATUS:  Appropriate. BACK:  Non-tender to palpation.  No CVAT SKIN:  Warm, dry and intact.     Results: U/A:  6-10 WBC, >30 RBC, mod bacteria, negative nitrite

## 2021-04-10 DIAGNOSIS — E7801 Familial hypercholesterolemia: Secondary | ICD-10-CM | POA: Diagnosis not present

## 2021-04-10 DIAGNOSIS — E782 Mixed hyperlipidemia: Secondary | ICD-10-CM | POA: Diagnosis not present

## 2021-04-10 DIAGNOSIS — E7849 Other hyperlipidemia: Secondary | ICD-10-CM | POA: Diagnosis not present

## 2021-04-10 DIAGNOSIS — E7800 Pure hypercholesterolemia, unspecified: Secondary | ICD-10-CM | POA: Diagnosis not present

## 2021-04-10 DIAGNOSIS — I10 Essential (primary) hypertension: Secondary | ICD-10-CM | POA: Diagnosis not present

## 2021-04-10 DIAGNOSIS — R748 Abnormal levels of other serum enzymes: Secondary | ICD-10-CM | POA: Diagnosis not present

## 2021-04-10 DIAGNOSIS — R7301 Impaired fasting glucose: Secondary | ICD-10-CM | POA: Diagnosis not present

## 2021-04-10 DIAGNOSIS — E1129 Type 2 diabetes mellitus with other diabetic kidney complication: Secondary | ICD-10-CM | POA: Diagnosis not present

## 2021-04-11 ENCOUNTER — Ambulatory Visit: Payer: Medicare HMO | Admitting: Urology

## 2021-04-12 DIAGNOSIS — Z6829 Body mass index (BMI) 29.0-29.9, adult: Secondary | ICD-10-CM | POA: Diagnosis not present

## 2021-04-12 DIAGNOSIS — I671 Cerebral aneurysm, nonruptured: Secondary | ICD-10-CM | POA: Diagnosis not present

## 2021-04-13 DIAGNOSIS — M791 Myalgia, unspecified site: Secondary | ICD-10-CM | POA: Diagnosis not present

## 2021-04-13 DIAGNOSIS — I10 Essential (primary) hypertension: Secondary | ICD-10-CM | POA: Diagnosis not present

## 2021-04-13 DIAGNOSIS — R748 Abnormal levels of other serum enzymes: Secondary | ICD-10-CM | POA: Diagnosis not present

## 2021-04-13 DIAGNOSIS — N401 Enlarged prostate with lower urinary tract symptoms: Secondary | ICD-10-CM | POA: Diagnosis not present

## 2021-04-13 DIAGNOSIS — E1129 Type 2 diabetes mellitus with other diabetic kidney complication: Secondary | ICD-10-CM | POA: Diagnosis not present

## 2021-04-13 DIAGNOSIS — E7849 Other hyperlipidemia: Secondary | ICD-10-CM | POA: Diagnosis not present

## 2021-04-13 DIAGNOSIS — T466X5A Adverse effect of antihyperlipidemic and antiarteriosclerotic drugs, initial encounter: Secondary | ICD-10-CM | POA: Diagnosis not present

## 2021-04-13 DIAGNOSIS — R809 Proteinuria, unspecified: Secondary | ICD-10-CM | POA: Diagnosis not present

## 2021-04-14 ENCOUNTER — Other Ambulatory Visit: Payer: Self-pay | Admitting: Neurosurgery

## 2021-04-17 ENCOUNTER — Other Ambulatory Visit: Payer: Self-pay

## 2021-04-17 ENCOUNTER — Encounter (HOSPITAL_COMMUNITY): Payer: Self-pay | Admitting: Anesthesiology

## 2021-04-17 ENCOUNTER — Encounter (HOSPITAL_COMMUNITY)
Admission: RE | Admit: 2021-04-17 | Discharge: 2021-04-17 | Disposition: A | Discharge status: Home / Self Care | Payer: Medicare HMO | Source: Ambulatory Visit | Attending: Urology | Admitting: Urology

## 2021-04-17 ENCOUNTER — Encounter (HOSPITAL_COMMUNITY): Payer: Self-pay

## 2021-04-17 DIAGNOSIS — Z01810 Encounter for preprocedural cardiovascular examination: Secondary | ICD-10-CM | POA: Diagnosis not present

## 2021-04-17 DIAGNOSIS — Z8673 Personal history of transient ischemic attack (TIA), and cerebral infarction without residual deficits: Secondary | ICD-10-CM | POA: Diagnosis not present

## 2021-04-17 NOTE — Patient Instructions (Signed)
William Leonard  04/17/2021     @PREFPERIOPPHARMACY @   Your procedure is scheduled on 04/19/2021.  Report to [facility] at 6:15 A.M.  Call this number if you have problems the morning of surgery:  [phone]   Remember:  Do not eat or drink after midnight.     Take these medicines the morning of surgery with A SIP OF WATER : Flomax    Do not wear jewelry, make-up or nail polish.  Do not wear lotions, powders, or perfumes, or deodorant.  Do not shave 48 hours prior to surgery.  Men may shave face and neck.  Do not bring valuables to the hospital.  [facility] is not responsible for any belongings or valuables.  Contacts, dentures or bridgework may not be worn into surgery.  Leave your suitcase in the car.  After surgery it may be brought to your room.  For patients admitted to the hospital, discharge time will be determined by your treatment team.  Patients discharged the day of surgery will not be allowed to drive home.   Name and phone number of your driver:   FAMILY Special instructions:  N/A  Please read over the following fact sheets that you were given. Care and Recovery After Surgery  Ureteroscopy Ureteroscopy is a procedure to check for and treat problems inside part of the urinary tract. In this procedure, a thin, flexible tube with a light at the end (ureteroscope) is used to look at the inside of the kidneys and the ureters. The ureters are the tubes that carry urine from the kidneys to the bladder. The ureteroscope is inserted into one or both of the ureters. You may need this procedure if you have frequent urinary tract infections (UTIs), blood in your urine, or a stone in one of your ureters. A ureteroscopy can be done: To find the cause of urine blockage in a ureter and to evaluate other abnormalities inside the ureters or kidneys. To remove stones. To remove or treat growths of tissue (polyps), abnormal tissue, and some types of tumors. To remove a tissue  sample and check it for disease under a microscope (biopsy). Tell a health care provider about: Any allergies you have. All medicines you are taking, including vitamins, herbs, eye drops, creams, and over-the-counter medicines. Any problems you or family members have had with anesthetic medicines. Any blood disorders you have. Any surgeries you have had. Any medical conditions you have. Whether you are pregnant or may be pregnant. What are the risks? Generally, this is a safe procedure. However, problems may occur, including: Bleeding. Infection. Allergic reactions to medicines. Scarring that narrows the ureter (stricture). Creating a hole in the ureter (perforation). What happens before the procedure? Staying hydrated Follow instructions from your health care provider about hydration, which may include: Up to 2 hours before the procedure - you may continue to drink clear liquids, such as water, clear fruit juice, black coffee, and plain tea.  Eating and drinking restrictions Follow instructions from your health care provider about eating and drinking, which may include: 8 hours before the procedure - stop eating heavy meals or foods, such as meat, fried foods, or fatty foods. 6 hours before the procedure - stop eating light meals or foods, such as toast or cereal. 6 hours before the procedure - stop drinking milk or drinks that contain milk. 2 hours before the procedure - stop drinking clear liquids. Medicines Ask your health care provider about: Changing or stopping your regular  medicines. This is especially important if you are taking diabetes medicines or blood thinners. Taking medicines such as aspirin and ibuprofen. These medicines can thin your blood. Do not take these medicines unless your health care provider tells you to take them. Taking over-the-counter medicines, vitamins, herbs, and supplements. General instructions Do not use any products that contain nicotine or  tobacco for at least 4 weeks before the procedure. These products include cigarettes, e-cigarettes, and chewing tobacco. If you need help quitting, ask your health care provider. You may have a urine sample taken to check for infection. Plan to have someone take you home from the hospital or clinic. If you will be going home right after the procedure, plan to have someone with you for 24 hours. Ask your health care provider what steps will be taken to help prevent infection. These may include: Washing skin with a germ-killing soap. Receiving antibiotic medicine. What happens during the procedure?  An IV will be inserted into one of your veins. You will be given one or more of the following: A medicine to help you relax (sedative). A medicine to make you fall asleep (general anesthetic). A medicine that is injected into your spine to numb the area below and slightly above the injection site (spinal anesthetic). The part of your body that drains urine from your bladder (urethra) will be cleaned with a germ-killing solution. The ureteroscope will be passed through your urethra into your bladder. A salt-water solution will be sent through the ureteroscope to fill your bladder. This will help the health care provider see the openings of your ureters more clearly. The ureteroscope will be passed into your ureter. If a growth is found, a biopsy may be done. If a stone is found, it may be removed through the ureteroscope, or the stone may be broken up using a laser, shock waves, or electrical energy. In some cases, if the ureter is too small, a tube may be inserted that keeps the ureter open (ureteral stent). The stent may be left in place for 1 or 2 weeks to keep the ureter open, and then the ureteroscopy procedure will be done. The scope will be removed, and your bladder will be emptied. The procedure may vary among health care providers and hospitals. What can I expect after the procedure? After  your procedure, it is common to have: Your blood pressure, heart rate, breathing rate, and blood oxygen level monitored until you leave the hospital or clinic. A burning sensation when you urinate. You may be asked to urinate. Blood in your urine. Mild discomfort in your bladder area or kidney area when urinating. A need to urinate more often or urgently. Follow these instructions at home: Medicines Take over-the-counter and prescription medicines only as told by your health care provider. If you were prescribed an antibiotic medicine, take it as told by your health care provider. Do not stop taking the antibiotic even if you start to feel better. General instructions  If you were given a sedative during the procedure, it can affect you for several hours. Do not drive or operate machinery until your health care provider says that it is safe. To relieve burning, take a warm bath or hold a warm washcloth over your groin. Drink enough fluid to keep your urine pale yellow. Drink two 8-ounce (237 mL) glasses of water every hour for the first 2 hours after you get home. Continue to drink water often at home. You can eat what you normally do. Keep  all follow-up visits as told by your health care provider. This is important. If you had a ureteral stent placed, ask your health care provider when you need to return to have it removed. Contact a health care provider if you have: Chills or a fever. Burning pain for longer than 24 hours after the procedure. Blood in your urine for longer than 24 hours after the procedure. Get help right away if you have: Large amounts of blood in your urine. Blood clots in your urine. Severe pain. Chest pain or trouble breathing. The feeling of a full bladder and you are unable to urinate. These symptoms may represent a serious problem that is an emergency. Do not wait to see if the symptoms will go away. Get medical help right away. Call your local emergency  services (911 in the [place] Summary Ureteroscopy is a procedure to check for and treat problems inside part of the urinary tract. In this procedure, a thin, flexible tube with a light at the end (ureteroscope) is used to look at the inside of the kidneys and the ureters. You may need this procedure if you have frequent urinary tract infections (UTIs), blood in your urine, or a stone in a ureter. This information is not intended to replace advice given to you by your health care provider. Make sure you discuss any questions you have with your health care provider. Document Revised: 01/25/2021 Document Reviewed: 02/18/2019 Elsevier Patient Education  2022 [place]. Cystoscopy Cystoscopy is a procedure that is used to help diagnose and sometimes treat conditions that affect the lower urinary tract. The lower urinary tract includes the bladder and the urethra. The urethra is the tube that drains urine from the bladder. Cystoscopy is done using a thin, tube-shaped instrument with a light and camera at the end (cystoscope). The cystoscope may be hard or flexible, depending on the goal of the procedure. The cystoscope is inserted through the urethra, into the bladder. Cystoscopy may be recommended if you have: Urinary tract infections that keep coming back. Blood in the urine (hematuria). An inability to control when you urinate (urinary incontinence) or an overactive bladder. Unusual cells found in a urine sample. A blockage in the urethra, such as a urinary stone. Painful urination. An abnormality in the bladder found during an intravenous pyelogram (IVP) or CT scan. Cystoscopy may also be done to remove a sample of tissue to be examined under a microscope (biopsy). Tell a health care provider about: Any allergies you have. All medicines you are taking, including vitamins, herbs, eye drops, creams, and over-the-counter medicines. Any problems you or family members have had with anesthetic  medicines. Any blood disorders you have. Any surgeries you have had. Any medical conditions you have. Whether you are pregnant or may be pregnant. What are the risks? Generally, this is a safe procedure. However, problems may occur, including: Infection. Bleeding. Allergic reactions to medicines. Damage to other structures or organs. What happens before the procedure? Medicines Ask your health care provider about: Changing or stopping your regular medicines. This is especially important if you are taking diabetes medicines or blood thinners. Taking medicines such as aspirin and ibuprofen. These medicines can thin your blood. Do not take these medicines unless your health care provider tells you to take them. Taking over-the-counter medicines, vitamins, herbs, and supplements. Tests You may have an exam or testing, such as: X-rays of the bladder, urethra, or kidneys. CT scan of the abdomen or pelvis. Urine tests to check for signs  of infection. General instructions Follow instructions from your health care provider about eating or drinking restrictions. Ask your health care provider what steps will be taken to help prevent infection. These steps may include: Washing skin with a germ-killing soap. Taking antibiotic medicine. Plan to have a responsible adult take you home from the hospital or clinic. What happens during the procedure?  You will be given one or more of the following: A medicine to help you relax (sedative). A medicine to numb the area (local anesthetic). The area around the opening of your urethra will be cleaned. The cystoscope will be passed through your urethra into your bladder. Germ-free (sterile) fluid will flow through the cystoscope to fill your bladder. The fluid will stretch your bladder so that your health care provider can clearly examine your bladder walls. Your doctor will look at the urethra and bladder. Your doctor may take a biopsy or remove  stones. The cystoscope will be removed, and your bladder will be emptied. The procedure may vary among health care providers and hospitals. What can I expect after the procedure? After the procedure, it is common to have: Some soreness or pain in your abdomen and urethra. Urinary symptoms. These include: Mild pain or burning when you urinate. Pain should stop within a few minutes after you urinate. This may last for up to 1 week. A small amount of blood in your urine for several days. Feeling like you need to urinate but producing only a small amount of urine. Follow these instructions at home: Medicines Take over-the-counter and prescription medicines only as told by your health care provider. If you were prescribed an antibiotic medicine, take it as told by your health care provider. Do not stop taking the antibiotic even if you start to feel better. General instructions Return to your normal activities as told by your health care provider. Ask your health care provider what activities are safe for you. If you were given a sedative during the procedure, it can affect you for several hours. Do not drive or operate machinery until your health care provider says that it is safe. Watch for any blood in your urine. If the amount of blood in your urine increases, call your health care provider. Follow instructions from your health care provider about eating or drinking restrictions. If a tissue sample was removed for testing (biopsy) during your procedure, it is up to you to get your test results. Ask your health care provider, or the department that is doing the test, when your results will be ready. Drink enough fluid to keep your urine pale yellow. Keep all follow-up visits. This is important. Contact a health care provider if: You have pain that gets worse or does not get better with medicine, especially pain when you urinate. You have trouble urinating. You have more blood in your  urine. Get help right away if: You have blood clots in your urine. You have abdominal pain. You have a fever or chills. You are unable to urinate. Summary Cystoscopy is a procedure that is used to help diagnose and sometimes treat conditions that affect the lower urinary tract. Cystoscopy is done using a thin, tube-shaped instrument with a light and camera at the end. After the procedure, it is common to have some soreness or pain in your abdomen and urethra. Watch for any blood in your urine. If the amount of blood in your urine increases, call your health care provider. If you were prescribed an antibiotic medicine, take it  as told by your health care provider. Do not stop taking the antibiotic even if you start to feel better. This information is not intended to replace advice given to you by your health care provider. Make sure you discuss any questions you have with your health care provider. Document Revised: 01/25/2021 Document Reviewed: 12/25/2019 Elsevier Patient Education  [place].

## 2021-04-18 ENCOUNTER — Telehealth: Payer: Self-pay | Admitting: Urology

## 2021-04-18 NOTE — Telephone Encounter (Signed)
William Leonard has recently been diagnosed with a cerebral aneurysm.  He is scheduled to undergo surgical management by Dr. Kathyrn Sheriff next month at [facility].  I discussed his case with Dr. Wyatt Haste in anesthesiology.  He feels that it would be best for the patient to have any surgical procedures done at a facility that has neurosurgical capabilities in case there were any problems intraoperatively or postoperatively.  I discussed these recommendations with the patient's wife.  She agrees with this recommendation.  I advised her that I will try to arrange the scheduled procedure with one of my partners who operates at [facility] or [facility].  We will cancel the surgery scheduled for tomorrow.

## 2021-04-19 ENCOUNTER — Ambulatory Visit (HOSPITAL_COMMUNITY): Admission: RE | Admit: 2021-04-19 | Payer: Medicare HMO | Source: Home / Self Care | Admitting: Urology

## 2021-04-19 SURGERY — CYSTOURETEROSCOPY, WITH RETROGRADE PYELOGRAM AND STENT INSERTION
Anesthesia: Choice | Laterality: Left

## 2021-04-20 DIAGNOSIS — N200 Calculus of kidney: Secondary | ICD-10-CM | POA: Diagnosis not present

## 2021-04-20 DIAGNOSIS — I671 Cerebral aneurysm, nonruptured: Secondary | ICD-10-CM | POA: Diagnosis not present

## 2021-04-20 DIAGNOSIS — Z8673 Personal history of transient ischemic attack (TIA), and cerebral infarction without residual deficits: Secondary | ICD-10-CM | POA: Diagnosis not present

## 2021-04-20 DIAGNOSIS — I10 Essential (primary) hypertension: Secondary | ICD-10-CM | POA: Diagnosis not present

## 2021-04-24 ENCOUNTER — Telehealth: Payer: Self-pay

## 2021-04-24 NOTE — Telephone Encounter (Signed)
Error

## 2021-04-25 ENCOUNTER — Encounter: Payer: Medicare HMO | Admitting: Urology

## 2021-04-26 ENCOUNTER — Other Ambulatory Visit: Payer: Self-pay | Admitting: Urology

## 2021-04-27 ENCOUNTER — Other Ambulatory Visit: Payer: Self-pay | Admitting: Neurosurgery

## 2021-05-04 ENCOUNTER — Emergency Department (HOSPITAL_COMMUNITY): Payer: Medicare HMO

## 2021-05-04 ENCOUNTER — Inpatient Hospital Stay (HOSPITAL_COMMUNITY)
Admission: EM | Admit: 2021-05-04 | Discharge: 2021-05-09 | DRG: 871 | Disposition: A | Discharge status: Home / Self Care | Payer: Medicare HMO | Attending: Student | Admitting: Student

## 2021-05-04 ENCOUNTER — Encounter (HOSPITAL_COMMUNITY): Payer: Self-pay | Admitting: *Deleted

## 2021-05-04 ENCOUNTER — Other Ambulatory Visit: Payer: Self-pay

## 2021-05-04 DIAGNOSIS — E119 Type 2 diabetes mellitus without complications: Secondary | ICD-10-CM | POA: Diagnosis not present

## 2021-05-04 DIAGNOSIS — Z7982 Long term (current) use of aspirin: Secondary | ICD-10-CM | POA: Diagnosis not present

## 2021-05-04 DIAGNOSIS — I959 Hypotension, unspecified: Secondary | ICD-10-CM | POA: Diagnosis not present

## 2021-05-04 DIAGNOSIS — N138 Other obstructive and reflux uropathy: Secondary | ICD-10-CM | POA: Diagnosis not present

## 2021-05-04 DIAGNOSIS — R918 Other nonspecific abnormal finding of lung field: Secondary | ICD-10-CM | POA: Diagnosis not present

## 2021-05-04 DIAGNOSIS — E876 Hypokalemia: Secondary | ICD-10-CM | POA: Diagnosis present

## 2021-05-04 DIAGNOSIS — R6521 Severe sepsis with septic shock: Secondary | ICD-10-CM | POA: Diagnosis not present

## 2021-05-04 DIAGNOSIS — Z8673 Personal history of transient ischemic attack (TIA), and cerebral infarction without residual deficits: Secondary | ICD-10-CM | POA: Diagnosis not present

## 2021-05-04 DIAGNOSIS — Z6831 Body mass index (BMI) 31.0-31.9, adult: Secondary | ICD-10-CM

## 2021-05-04 DIAGNOSIS — E669 Obesity, unspecified: Secondary | ICD-10-CM | POA: Diagnosis present

## 2021-05-04 DIAGNOSIS — A419 Sepsis, unspecified organism: Principal | ICD-10-CM | POA: Diagnosis present

## 2021-05-04 DIAGNOSIS — Z79899 Other long term (current) drug therapy: Secondary | ICD-10-CM

## 2021-05-04 DIAGNOSIS — N179 Acute kidney failure, unspecified: Secondary | ICD-10-CM | POA: Diagnosis not present

## 2021-05-04 DIAGNOSIS — K2950 Unspecified chronic gastritis without bleeding: Secondary | ICD-10-CM | POA: Diagnosis not present

## 2021-05-04 DIAGNOSIS — Z91013 Allergy to seafood: Secondary | ICD-10-CM

## 2021-05-04 DIAGNOSIS — K269 Duodenal ulcer, unspecified as acute or chronic, without hemorrhage or perforation: Secondary | ICD-10-CM | POA: Diagnosis not present

## 2021-05-04 DIAGNOSIS — K644 Residual hemorrhoidal skin tags: Secondary | ICD-10-CM | POA: Diagnosis present

## 2021-05-04 DIAGNOSIS — K2980 Duodenitis without bleeding: Secondary | ICD-10-CM | POA: Diagnosis not present

## 2021-05-04 DIAGNOSIS — K5900 Constipation, unspecified: Secondary | ICD-10-CM | POA: Diagnosis present

## 2021-05-04 DIAGNOSIS — I10 Essential (primary) hypertension: Secondary | ICD-10-CM | POA: Diagnosis present

## 2021-05-04 DIAGNOSIS — I361 Nonrheumatic tricuspid (valve) insufficiency: Secondary | ICD-10-CM | POA: Diagnosis not present

## 2021-05-04 DIAGNOSIS — D649 Anemia, unspecified: Secondary | ICD-10-CM

## 2021-05-04 DIAGNOSIS — I340 Nonrheumatic mitral (valve) insufficiency: Secondary | ICD-10-CM | POA: Diagnosis not present

## 2021-05-04 DIAGNOSIS — K264 Chronic or unspecified duodenal ulcer with hemorrhage: Secondary | ICD-10-CM | POA: Diagnosis present

## 2021-05-04 DIAGNOSIS — E872 Acidosis, unspecified: Secondary | ICD-10-CM | POA: Diagnosis not present

## 2021-05-04 DIAGNOSIS — N400 Enlarged prostate without lower urinary tract symptoms: Secondary | ICD-10-CM | POA: Diagnosis not present

## 2021-05-04 DIAGNOSIS — D62 Acute posthemorrhagic anemia: Secondary | ICD-10-CM | POA: Diagnosis present

## 2021-05-04 DIAGNOSIS — R0902 Hypoxemia: Secondary | ICD-10-CM | POA: Diagnosis not present

## 2021-05-04 DIAGNOSIS — I671 Cerebral aneurysm, nonruptured: Secondary | ICD-10-CM | POA: Diagnosis not present

## 2021-05-04 DIAGNOSIS — Z87891 Personal history of nicotine dependence: Secondary | ICD-10-CM | POA: Diagnosis not present

## 2021-05-04 DIAGNOSIS — E785 Hyperlipidemia, unspecified: Secondary | ICD-10-CM | POA: Diagnosis present

## 2021-05-04 DIAGNOSIS — N136 Pyonephrosis: Secondary | ICD-10-CM | POA: Diagnosis present

## 2021-05-04 DIAGNOSIS — E871 Hypo-osmolality and hyponatremia: Secondary | ICD-10-CM | POA: Diagnosis not present

## 2021-05-04 DIAGNOSIS — K319 Disease of stomach and duodenum, unspecified: Secondary | ICD-10-CM | POA: Diagnosis not present

## 2021-05-04 DIAGNOSIS — Z87440 Personal history of urinary (tract) infections: Secondary | ICD-10-CM | POA: Diagnosis not present

## 2021-05-04 DIAGNOSIS — K449 Diaphragmatic hernia without obstruction or gangrene: Secondary | ICD-10-CM | POA: Diagnosis present

## 2021-05-04 DIAGNOSIS — N401 Enlarged prostate with lower urinary tract symptoms: Secondary | ICD-10-CM | POA: Diagnosis not present

## 2021-05-04 DIAGNOSIS — N471 Phimosis: Secondary | ICD-10-CM | POA: Diagnosis present

## 2021-05-04 DIAGNOSIS — Z87442 Personal history of urinary calculi: Secondary | ICD-10-CM | POA: Diagnosis not present

## 2021-05-04 DIAGNOSIS — N210 Calculus in bladder: Secondary | ICD-10-CM | POA: Diagnosis not present

## 2021-05-04 DIAGNOSIS — K5730 Diverticulosis of large intestine without perforation or abscess without bleeding: Secondary | ICD-10-CM | POA: Diagnosis not present

## 2021-05-04 DIAGNOSIS — D500 Iron deficiency anemia secondary to blood loss (chronic): Secondary | ICD-10-CM | POA: Diagnosis not present

## 2021-05-04 DIAGNOSIS — R579 Shock, unspecified: Secondary | ICD-10-CM | POA: Diagnosis not present

## 2021-05-04 DIAGNOSIS — R531 Weakness: Secondary | ICD-10-CM | POA: Diagnosis not present

## 2021-05-04 DIAGNOSIS — R000 Tachycardia, unspecified: Secondary | ICD-10-CM | POA: Diagnosis not present

## 2021-05-04 DIAGNOSIS — Z20822 Contact with and (suspected) exposure to covid-19: Secondary | ICD-10-CM | POA: Diagnosis not present

## 2021-05-04 DIAGNOSIS — R933 Abnormal findings on diagnostic imaging of other parts of digestive tract: Secondary | ICD-10-CM

## 2021-05-04 DIAGNOSIS — I517 Cardiomegaly: Secondary | ICD-10-CM | POA: Diagnosis not present

## 2021-05-04 DIAGNOSIS — R300 Dysuria: Secondary | ICD-10-CM | POA: Diagnosis not present

## 2021-05-04 DIAGNOSIS — I422 Other hypertrophic cardiomyopathy: Secondary | ICD-10-CM | POA: Diagnosis not present

## 2021-05-04 DIAGNOSIS — N10 Acute tubulo-interstitial nephritis: Secondary | ICD-10-CM | POA: Diagnosis not present

## 2021-05-04 DIAGNOSIS — I425 Other restrictive cardiomyopathy: Secondary | ICD-10-CM | POA: Diagnosis not present

## 2021-05-04 LAB — COMPREHENSIVE METABOLIC PANEL
ALT: 64 U/L — ABNORMAL HIGH (ref 0–44)
AST: 57 U/L — ABNORMAL HIGH (ref 15–41)
Albumin: 3.1 g/dL — ABNORMAL LOW (ref 3.5–5.0)
Alkaline Phosphatase: 277 U/L — ABNORMAL HIGH (ref 38–126)
Anion gap: 13 (ref 5–15)
BUN: 28 mg/dL — ABNORMAL HIGH (ref 8–23)
CO2: 20 mmol/L — ABNORMAL LOW (ref 22–32)
Calcium: 8.3 mg/dL — ABNORMAL LOW (ref 8.9–10.3)
Chloride: 101 mmol/L (ref 98–111)
Creatinine, Ser: 1.38 mg/dL — ABNORMAL HIGH (ref 0.61–1.24)
GFR, Estimated: 53 mL/min — ABNORMAL LOW (ref >60)
Glucose, Bld: 159 mg/dL — ABNORMAL HIGH (ref 70–99)
Potassium: 3.3 mmol/L — ABNORMAL LOW (ref 3.5–5.1)
Sodium: 134 mmol/L — ABNORMAL LOW (ref 135–145)
Total Bilirubin: 0.2 mg/dL — ABNORMAL LOW (ref 0.3–1.2)
Total Protein: 6.7 g/dL (ref 6.5–8.1)

## 2021-05-04 LAB — CBC WITH DIFFERENTIAL/PLATELET
Abs Immature Granulocytes: 0.02 10*3/uL (ref 0.00–0.07)
Basophils Absolute: 0 10*3/uL (ref 0.0–0.1)
Basophils Relative: 0 %
Eosinophils Absolute: 0.1 10*3/uL (ref 0.0–0.5)
Eosinophils Relative: 1 %
HCT: 21.3 % — ABNORMAL LOW (ref 39.0–52.0)
Hemoglobin: 6.7 g/dL — CL (ref 13.0–17.0)
Immature Granulocytes: 0 %
Lymphocytes Relative: 4 %
Lymphs Abs: 0.2 10*3/uL — ABNORMAL LOW (ref 0.7–4.0)
MCH: 27.3 pg (ref 26.0–34.0)
MCHC: 31.5 g/dL (ref 30.0–36.0)
MCV: 86.9 fL (ref 80.0–100.0)
Monocytes Absolute: 0.3 10*3/uL (ref 0.1–1.0)
Monocytes Relative: 4 %
Neutro Abs: 5.3 10*3/uL (ref 1.7–7.7)
Neutrophils Relative %: 91 %
Platelets: 207 10*3/uL (ref 150–400)
RBC: 2.45 MIL/uL — ABNORMAL LOW (ref 4.22–5.81)
RDW: 15.1 % (ref 11.5–15.5)
WBC: 5.9 10*3/uL (ref 4.0–10.5)
nRBC: 0 % (ref 0.0–0.2)

## 2021-05-04 LAB — LACTIC ACID, PLASMA
Lactic Acid, Venous: 5.2 mmol/L (ref 0.5–1.9)
Lactic Acid, Venous: 3.1 mmol/L (ref 0.5–1.9)

## 2021-05-04 LAB — APTT: aPTT: 29 seconds (ref 24–36)

## 2021-05-04 LAB — RESP PANEL BY RT-PCR (FLU A&B, COVID) ARPGX2
Influenza A by PCR: NEGATIVE
Influenza B by PCR: NEGATIVE
SARS Coronavirus 2 by RT PCR: NEGATIVE

## 2021-05-04 LAB — PROTIME-INR
INR: 1.2 (ref 0.8–1.2)
Prothrombin Time: 14.8 seconds (ref 11.4–15.2)

## 2021-05-04 LAB — PREPARE RBC (CROSSMATCH)

## 2021-05-04 LAB — ABO/RH: ABO/RH(D): O POS

## 2021-05-04 MED ORDER — VANCOMYCIN HCL 1500 MG/300ML IV SOLN
1500.0000 mg | INTRAVENOUS | Status: DC
  Administered 2021-05-05: 1500 mg via INTRAVENOUS
  Filled 2021-05-04: qty 300

## 2021-05-04 MED ORDER — VANCOMYCIN HCL IN DEXTROSE 1-5 GM/200ML-% IV SOLN: 1000.0000 mg | Freq: Once | INTRAVENOUS | Status: DC

## 2021-05-04 MED ORDER — SODIUM CHLORIDE 0.9 % IV SOLN
2.0000 g | Freq: Once | INTRAVENOUS | Status: AC
  Administered 2021-05-04: 2 g via INTRAVENOUS
  Filled 2021-05-04: qty 2

## 2021-05-04 MED ORDER — ACETAMINOPHEN 500 MG PO TABS
1000.0000 mg | ORAL_TABLET | Freq: Once | ORAL | Status: AC
  Administered 2021-05-04: 1000 mg via ORAL
  Filled 2021-05-04: qty 2

## 2021-05-04 MED ORDER — LACTATED RINGERS IV BOLUS
2000.0000 mL | Freq: Once | INTRAVENOUS | Status: AC
  Administered 2021-05-04: 2000 mL via INTRAVENOUS

## 2021-05-04 MED ORDER — NOREPINEPHRINE 4 MG/250ML-% IV SOLN
2.0000 ug/min | INTRAVENOUS | Status: DC
  Administered 2021-05-04: 2 ug/min via INTRAVENOUS
  Administered 2021-05-05: 5 ug/min via INTRAVENOUS
  Administered 2021-05-06: 6 ug/min via INTRAVENOUS
  Filled 2021-05-04 (×4): qty 250

## 2021-05-04 MED ORDER — SODIUM CHLORIDE 0.9 % IV SOLN
250.0000 mL | INTRAVENOUS | Status: DC
  Administered 2021-05-06: 250 mL via INTRAVENOUS

## 2021-05-04 MED ORDER — LACTATED RINGERS IV BOLUS (SEPSIS)
1000.0000 mL | Freq: Once | INTRAVENOUS | Status: AC
  Administered 2021-05-04: 1000 mL via INTRAVENOUS

## 2021-05-04 MED ORDER — METRONIDAZOLE 500 MG/100ML IV SOLN
500.0000 mg | Freq: Once | INTRAVENOUS | Status: AC
  Administered 2021-05-04: 500 mg via INTRAVENOUS
  Filled 2021-05-04: qty 100

## 2021-05-04 MED ORDER — SODIUM CHLORIDE 0.9 % IV SOLN
2.0000 g | Freq: Two times a day (BID) | INTRAVENOUS | Status: DC
  Administered 2021-05-05 (×2): 2 g via INTRAVENOUS
  Filled 2021-05-04 (×3): qty 2

## 2021-05-04 MED ORDER — LACTATED RINGERS IV SOLN: INTRAVENOUS | Status: AC

## 2021-05-04 NOTE — ED Notes (Signed)
Both blood cultures collected

## 2021-05-04 NOTE — ED Triage Notes (Signed)
Pt brought in by [facility] for generalized weakness all day today.

## 2021-05-04 NOTE — Sepsis Progress Note (Signed)
Sepsis protocol is being followed by [facility]. 

## 2021-05-04 NOTE — ED Notes (Signed)
POC Occult negative. Will not transfer to epic.

## 2021-05-04 NOTE — ED Provider Notes (Addendum)
[facility] EMERGENCY DEPARTMENT Provider Note   CSN: [id number] Arrival date & time: 05/04/21  1907     History Chief Complaint  Patient presents with   Weakness    RYANE CANAVAN is a 75 y.o. male.  HPI  75 year old male with past medical history of BPH, HTN, HLD, DM, previous CVA presents emergency department with generalized weakness.  Patient states has been going on for the past 2 days.  Today he was experiencing nausea/vomiting.  Denies any diarrhea.  He has had a significant decrease in appetite.  Endorses chills but no documented fever at home.  No ongoing abdominal pain, chest pain, cough.  No known sick contacts.  Past Medical History:  Diagnosis Date   BPH with urinary obstruction    Diabetes mellitus without complication ([place])    Hyperlipidemia    Stroke ([place]) 01/2021    Patient Active Problem List   Diagnosis Date Noted   History of UTI 03/30/2021   Ureteral calculus, left 03/30/2021   Phimosis 03/30/2021    Past Surgical History:  Procedure Laterality Date   CYSTOSCOPY     IR 3D INDEPENDENT WKST  04/04/2021   IR ANGIO INTRA EXTRACRAN SEL INTERNAL CAROTID BILAT MOD SED  04/04/2021   IR ANGIO VERTEBRAL SEL VERTEBRAL UNI L MOD SED  04/04/2021       History reviewed. No pertinent family history.  Social History   Tobacco Use   Smoking status: Former    Types: Cigarettes   Smokeless tobacco: Never  Substance Use Topics   Alcohol use: Yes   Drug use: Not Currently    Home Medications Prior to Admission medications   Medication Sig Start Date End Date Taking? Authorizing Provider  aspirin 325 MG EC tablet Take 325 mg by mouth daily. 03/22/21 03/22/22  [provider]  atorvastatin (LIPITOR) 40 MG tablet Take 40 mg by mouth daily. 03/22/21 04/21/21  [provider]  docusate sodium (COLACE) 100 MG capsule Take 200 mg by mouth at bedtime.    [provider]  Multiple Vitamin (MULTIVITAMIN WITH MINERALS) TABS tablet Take 1  tablet by mouth daily.    [provider]  nystatin ointment (MYCOSTATIN) Apply 1 application topically 2 (two) times daily. 03/30/21   Stoneking, Reece Leader., MD  senna (SENOKOT) 8.6 MG tablet Take 3 tablets by mouth in the morning. 02/25/21   [provider]  tamsulosin (FLOMAX) 0.4 MG CAPS capsule Take 0.4 mg by mouth daily.    [provider]  triamcinolone cream (KENALOG) 0.1 % Apply 1 application topically 2 (two) times daily. 02/06/21   [provider]    Allergies    Other and Shellfish allergy  Review of Systems   Review of Systems  Constitutional:  Positive for appetite change, chills and fatigue. Negative for fever.  HENT:  Positive for rhinorrhea. Negative for congestion.   Eyes:  Negative for visual disturbance.  Respiratory:  Negative for shortness of breath.   Cardiovascular:  Negative for chest pain.  Gastrointestinal:  Positive for constipation, nausea and vomiting. Negative for abdominal pain and diarrhea.  Genitourinary:  Negative for dysuria.  Musculoskeletal:  Negative for back pain and neck pain.  Skin:  Negative for rash.  Neurological:  Negative for headaches.   Physical Exam Updated Vital Signs BP (!) 70/48   Pulse (!) 124   Temp (!) 102.6 F (39.2 C) (Rectal)   Resp (!) 33   Ht 6\' 2"  (1.88 m)   Wt 107 kg  SpO2 100%   BMI 30.30 kg/m   Physical Exam Vitals and nursing note reviewed.  Constitutional:      General: He is not in acute distress.    Appearance: Normal appearance. He is ill-appearing.  HENT:     Head: Normocephalic.     Mouth/Throat:     Mouth: Mucous membranes are moist.  Eyes:     Pupils: Pupils are equal, round, and reactive to light.  Cardiovascular:     Rate and Rhythm: Tachycardia present.  Pulmonary:     Effort: Pulmonary effort is normal. No respiratory distress.  Abdominal:     Palpations: Abdomen is soft.     Tenderness: There is no abdominal tenderness. There is no guarding or rebound.   Genitourinary:    Comments: External hemorrhoid, light brown stool, no blood.  Chaperoned by Layla Maw Musculoskeletal:        General: No deformity.     Cervical back: No rigidity or tenderness.  Skin:    General: Skin is warm.     Coloration: Skin is pale.  Neurological:     Mental Status: He is alert and oriented to person, place, and time. Mental status is at baseline.  Psychiatric:        Mood and Affect: Mood normal.    ED Results / Procedures / Treatments   Labs (all labs ordered are listed, but only abnormal results are displayed) Labs Reviewed  RESP PANEL BY RT-PCR (FLU A&B, COVID) ARPGX2  CULTURE, BLOOD (ROUTINE X 2)  CULTURE, BLOOD (ROUTINE X 2)  LACTIC ACID, PLASMA  LACTIC ACID, PLASMA  COMPREHENSIVE METABOLIC PANEL  CBC WITH DIFFERENTIAL/PLATELET  PROTIME-INR  APTT  URINALYSIS, ROUTINE W REFLEX MICROSCOPIC    EKG None  Radiology No results found.  Procedures .Critical Care Performed by: Lorelle Gibbs, DO Authorized by: Lorelle Gibbs, DO   Critical care provider statement:    Critical care time (minutes):  60   Critical care time was exclusive of:  Separately billable procedures and treating other patients   Critical care was necessary to treat or prevent imminent or life-threatening deterioration of the following conditions:  Sepsis and shock   Critical care was time spent personally by me on the following activities:  Development of treatment plan with patient or surrogate, discussions with consultants, evaluation of patient's response to treatment, examination of patient, ordering and review of laboratory studies, ordering and review of radiographic studies, ordering and performing treatments and interventions, pulse oximetry, re-evaluation of patient's condition and review of old charts   I assumed direction of critical care for this patient from another provider in my specialty: no     Care discussed with: admitting provider      Medications Ordered in ED Medications  lactated ringers infusion ( Intravenous New Bag/Given 05/04/21 1954)  lactated ringers bolus 1,000 mL (1,000 mLs Intravenous New Bag/Given 05/04/21 1954)    ED Course  I have reviewed the triage vital signs and the nursing notes.  Pertinent labs & imaging results that were available during my care of the patient were reviewed by me and considered in my medical decision making (see chart for details).    MDM Rules/Calculators/A&P                           75 year old male presents emergency department with nausea/vomiting and weakness.  He is febrile, hypotensive and tachycardic on arrival but awake.  No acute distress.  Abdomen  is soft and nonacute.  Chart review in [place] shows recent admissions for urosepsis where a left ureteral stent was placed.  Documented multiple rounds of antibiotics.  Septic protocol initiated.  Patient to receive septic protocol fluids of LR, does have history of CHF and will be mindful.  Chest x-ray does show some mild cardiomegaly and pulmonary congestion to start with.  Currently respirations are easy, unlabored, no significant rales on lung auscultation.  Blood work shows no significant leukocytosis.  Does identify an anemia of 6.7, down from a baseline of 8.  Flu and COVID are negative.  Mild metabolic derangements on CMP.  Bedside fecal occult is negative, coags are normal.    CT of the abdomen pelvis done without contrast identifies duodenitis as well as a left ureteral stent that is in place with possible findings of periureteral stranding and possible infection.  We will correlate with urinalysis.  Bladder scan shows that he has around 100 cc in the bladder.  Of note the CT did also mention unable to rule out ischemic bowel.  Patient has no significant abdominal pain, abdomen is completely soft, fecal occult is negative, lactic was elevated, downtrending with fluids and most likely related to sepsis/septic  shock.  Have a very low suspicion for ischemic bowel at this time.  Do not feel emergent surgical consultation is warranted.  After septic protocol fluids patient continues to have hypotension with low maps.  Consulted with ICU Dr. Who agrees for admission and will start Levophed.  Patient for covered broadly with antibiotics.  On reevaluation he looks much improved.  Is conversational, abdomen remains completely benign.  Patients evaluation and results requires admission for further treatment and care. Patient agrees with admission plan, offers no new complaints and is stable/unchanged at time of admit.  Final Clinical Impression(s) / ED Diagnoses Final diagnoses:  None    Rx / DC Orders ED Discharge Orders     None        Lorelle Gibbs, DO 05/04/21 2322    Lorelle Gibbs, DO 05/04/21 2323    Crystin Lechtenberg, Alvin Critchley, DO 05/04/21 2332

## 2021-05-04 NOTE — Progress Notes (Signed)
Pharmacy Antibiotic Note  William Leonard is a 75 y.o. male admitted on 05/04/2021 with  unknown source sepsis .  Pharmacy has been consulted for vanc/cefepime dosing.  Pt presented with weakness. COVID/flu neg. Vanc/cefepime ordered to r/o sepsis.   Scr 1.38 >> CrCl 58 ml/min Wbc wnl LA 3.1  Plan: Vanc 1.5g IV q24>>AUC 461, scr 1.38 Cefepime 2g IV q12 Level if needed MRSA PCR  Height: 6\' 2"  (188 cm) Weight: 107 kg (236 lb) IBW/kg (Calculated) : 82.2  Temp (24hrs), Avg:101.1 F (38.4 C), Min:99 F (37.2 C), Max:102.6 F (39.2 C)  Recent Labs  Lab 05/04/21 1937 05/04/21 2134  WBC 5.9  --   CREATININE 1.38*  --   LATICACIDVEN 5.2* 3.1*    Estimated Creatinine Clearance: 58.4 mL/min (A) (by C-G formula based on SCr of 1.38 mg/dL (H)).    Allergies  Allergen Reactions   Other Nausea And Vomiting    Blue cheese    Shellfish Allergy Nausea And Vomiting    mussels    Antimicrobials this admission: 12/8 cefepime>> 12/8 vanc>>  Dose adjustments this admission:   Microbiology results: 12/8 blood>>  Onnie Boer, PharmD, BCIDP, AAHIVP, CPP Infectious Disease Pharmacist 05/04/2021 10:28 PM

## 2021-05-05 DIAGNOSIS — D649 Anemia, unspecified: Secondary | ICD-10-CM

## 2021-05-05 DIAGNOSIS — K264 Chronic or unspecified duodenal ulcer with hemorrhage: Secondary | ICD-10-CM

## 2021-05-05 DIAGNOSIS — R933 Abnormal findings on diagnostic imaging of other parts of digestive tract: Secondary | ICD-10-CM | POA: Diagnosis not present

## 2021-05-05 DIAGNOSIS — R579 Shock, unspecified: Secondary | ICD-10-CM | POA: Diagnosis not present

## 2021-05-05 LAB — BASIC METABOLIC PANEL
Anion gap: 10 (ref 5–15)
BUN: 22 mg/dL (ref 8–23)
CO2: 22 mmol/L (ref 22–32)
Calcium: 8.2 mg/dL — ABNORMAL LOW (ref 8.9–10.3)
Chloride: 104 mmol/L (ref 98–111)
Creatinine, Ser: 1.21 mg/dL (ref 0.61–1.24)
GFR, Estimated: >60 mL/min (ref >60)
Glucose, Bld: 101 mg/dL — ABNORMAL HIGH (ref 70–99)
Potassium: 3.6 mmol/L (ref 3.5–5.1)
Sodium: 136 mmol/L (ref 135–145)

## 2021-05-05 LAB — URINALYSIS, ROUTINE W REFLEX MICROSCOPIC
Bilirubin Urine: NEGATIVE
Glucose, UA: NEGATIVE mg/dL
Ketones, ur: NEGATIVE mg/dL
Leukocytes,Ua: NEGATIVE
Nitrite: NEGATIVE
Protein, ur: 30 mg/dL — AB
Specific Gravity, Urine: 1.01 (ref 1.005–1.030)
pH: 6 (ref 5.0–8.0)

## 2021-05-05 LAB — HEMOGLOBIN AND HEMATOCRIT, BLOOD
HCT: 23.4 % — ABNORMAL LOW (ref 39.0–52.0)
Hemoglobin: 7.7 g/dL — ABNORMAL LOW (ref 13.0–17.0)

## 2021-05-05 LAB — CBC
HCT: 19 % — ABNORMAL LOW (ref 39.0–52.0)
Hemoglobin: 5.9 g/dL — CL (ref 13.0–17.0)
MCH: 26.5 pg (ref 26.0–34.0)
MCHC: 31.1 g/dL (ref 30.0–36.0)
MCV: 85.2 fL (ref 80.0–100.0)
Platelets: 206 10*3/uL (ref 150–400)
RBC: 2.23 MIL/uL — ABNORMAL LOW (ref 4.22–5.81)
RDW: 15.3 % (ref 11.5–15.5)
WBC: 6.8 10*3/uL (ref 4.0–10.5)
nRBC: 0 % (ref 0.0–0.2)

## 2021-05-05 LAB — URINALYSIS, MICROSCOPIC (REFLEX)

## 2021-05-05 LAB — MRSA NEXT GEN BY PCR, NASAL: MRSA by PCR Next Gen: NOT DETECTED

## 2021-05-05 LAB — GLUCOSE, CAPILLARY
Glucose-Capillary: 126 mg/dL — ABNORMAL HIGH (ref 70–99)
Glucose-Capillary: 109 mg/dL — ABNORMAL HIGH (ref 70–99)

## 2021-05-05 LAB — PREPARE RBC (CROSSMATCH)

## 2021-05-05 LAB — MAGNESIUM: Magnesium: 2 mg/dL (ref 1.7–2.4)

## 2021-05-05 LAB — PHOSPHORUS: Phosphorus: 3.9 mg/dL (ref 2.5–4.6)

## 2021-05-05 MED ORDER — CHLORHEXIDINE GLUCONATE CLOTH 2 % EX PADS: 6.0000 | MEDICATED_PAD | Freq: Every day | CUTANEOUS | Status: DC

## 2021-05-05 MED ORDER — PANTOPRAZOLE SODIUM 40 MG IV SOLR: 40.0000 mg | Freq: Two times a day (BID) | INTRAVENOUS | Status: DC

## 2021-05-05 MED ORDER — SODIUM CHLORIDE 0.9% IV SOLUTION: Freq: Once | INTRAVENOUS | Status: AC

## 2021-05-05 MED ORDER — HEPARIN SODIUM (PORCINE) 5000 UNIT/ML IJ SOLN: 5000.0000 [IU] | Freq: Three times a day (TID) | INTRAMUSCULAR | Status: DC

## 2021-05-05 MED ORDER — POLYETHYLENE GLYCOL 3350 17 G PO PACK: 17.0000 g | PACK | Freq: Every day | ORAL | Status: DC | PRN

## 2021-05-05 MED ORDER — PANTOPRAZOLE 80MG IVPB - SIMPLE MED
80.0000 mg | Freq: Once | INTRAVENOUS | Status: AC
  Administered 2021-05-05: 80 mg via INTRAVENOUS
  Filled 2021-05-05: qty 80

## 2021-05-05 MED ORDER — DOCUSATE SODIUM 100 MG PO CAPS: 100.0000 mg | ORAL_CAPSULE | Freq: Two times a day (BID) | ORAL | Status: DC | PRN

## 2021-05-05 MED ORDER — PANTOPRAZOLE INFUSION (NEW) - SIMPLE MED
8.0000 mg/h | INTRAVENOUS | Status: DC
  Administered 2021-05-05 – 2021-05-06 (×3): 8 mg/h via INTRAVENOUS
  Filled 2021-05-05: qty 100
  Filled 2021-05-05 (×3): qty 80

## 2021-05-05 MED ORDER — CHLORHEXIDINE GLUCONATE CLOTH 2 % EX PADS
6.0000 | MEDICATED_PAD | Freq: Every day | CUTANEOUS | Status: DC
  Administered 2021-05-05 – 2021-05-08 (×4): 6 via TOPICAL

## 2021-05-05 NOTE — Progress Notes (Signed)
GI input appreciated; get shock under control and EGD prior to DC, diet resumed, appreciate help.  Erskine Emery MD PCCM

## 2021-05-05 NOTE — Progress Notes (Signed)
Date and time results received: 05/05/21 1005 (use smartphrase ".now" to insert current time)  Test: Hgb Critical Value: 5.9  Name of Provider Notified: Dr. Marva Panda  Orders Received? Or Actions Taken?: see orders

## 2021-05-05 NOTE — H&P (Signed)
NAME:  William Leonard, MRN:  [id number], DOB:  April 27, 1946, LOS: 1 ADMISSION DATE:  05/04/2021, CONSULTATION DATE:  05/05/2021 REFERRING MD: [facility] emergency room, CHIEF COMPLAINT: Generalized weakness  History of Present Illness:  This is a 75 year old white male who presented to the emergency room at [facility] with generalized weakness, fevers, nausea and started proxy 2 days ago but is gotten significantly worse over the last 12 hours prior to arrival.  Patient states that currently he feels like he is back at his normal baseline.  He denies any significant pain.  He does have a history of urosepsis and left ureteral calculus.  Patient had a ureteral stent was placed on 01/30/2021.  He presented back to the hospital on 03/23/2021 with greater than 100,000 E. coli urinary tract infection.  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 rate (!) 24, height 6\' 2"  (1.88 m), weight 107 kg, SpO2 98 %.        Intake/Output Summary (Last 24 hours) at 05/05/2021 0313 Last data filed at 05/05/2021 0300 Gross per 24 hour  Intake 3543.68 ml  Output --  Net 3543.68 ml   Filed Weights   05/04/21 1915  Weight: 107 kg    Examination: General: No acute distress HENT: Atraumatic/normocephalic mucous membranes are moist. Lungs: Lungs are clear bilaterally and there is no significant wheezing rales or rhonchi noted. Cardiovascular: Heart regular rate no rub murmur gallop appreciated Abdomen: Soft, nontender, nondistended, no rebound/rigidity/guarding. Extremities: +2 edema of the lower extremities.  No significant cyanosis or edema  noted. Neuro: Patient is conscious alert and orient x3.  No focal motor deficits. Skin: No rashes contusions abrasions or penetrating injuries  Assessment & Plan:  Septic shock Urosepsis Right cerebral artery aneurysm Anemia Mild AKI  Plan: Patient is admitted to the intensive care unit for further work-up. Patient is on very low-dose Levophed and hopefully will be able to wean that off while maintaining a MAP over 60. Broad-spectrum antibiotics to be initiated.  Appears that the urine is the most likely cause. Currently patient is doing well on room air.  Patient does not appear respiratory distress. Will start p.o. diet. Transfuse 1 unit packed RBCs now.  No active signs of bleeding seen. Continue to monitor H/H. Replete potassium per protocol.  Best Practice (right click and "Reselect all SmartList Selections" daily)   Diet/type: clear liquids DVT prophylaxis: LMWH GI prophylaxis: PPI Lines: N/A Foley:  N/A Code Status:  full code Last date of multidisciplinary goals of care discussion []   Labs   CBC: Recent Labs  Lab 05/04/21 1937  WBC 5.9  NEUTROABS 5.3  HGB 6.7*  HCT 21.3*  MCV 86.9  PLT 016    Basic Metabolic Panel: Recent Labs  Lab 05/04/21 1937  NA 134*  K 3.3*  CL 101  CO2 20*  GLUCOSE 159*  BUN 28*  CREATININE 1.38*  CALCIUM 8.3*   GFR: Estimated Creatinine Clearance: 58.4 mL/min (A) (by C-G formula based on SCr of 1.38 mg/dL (H)). Recent Labs  Lab 05/04/21 1937 05/04/21 2134  WBC 5.9  --   LATICACIDVEN 5.2* 3.1*    Liver Function Tests: Recent Labs  Lab 05/04/21  1937  AST 57*  ALT 64*  ALKPHOS 277*  BILITOT 0.2*  PROT 6.7  ALBUMIN 3.1*   No results for input(s): LIPASE, AMYLASE in the last 168 hours. No results for input(s): AMMONIA in the last 168 hours.  ABG No results found for: PHART, PCO2ART, PO2ART, HCO3, TCO2, ACIDBASEDEF, O2SAT   Coagulation Profile: Recent Labs  Lab 05/04/21 1937  INR 1.2    Cardiac  Enzymes: No results for input(s): CKTOTAL, CKMB, CKMBINDEX, TROPONINI in the last 168 hours.  HbA1C: No results found for: HGBA1C  CBG: Recent Labs  Lab 05/05/21 0210  GLUCAP 126*    Review of Systems:   10 point review of systems completed.  Pertinent positives were documented in the HPI.  Past Medical History:  He,  has a past medical history of BPH with urinary obstruction, Diabetes mellitus without complication ([place]), Hyperlipidemia, and Stroke ([place]) (01/2021).   Surgical History:   Past Surgical History:  Procedure Laterality Date   CYSTOSCOPY     IR 3D INDEPENDENT WKST  04/04/2021   IR ANGIO INTRA EXTRACRAN SEL INTERNAL CAROTID BILAT MOD SED  04/04/2021   IR ANGIO VERTEBRAL SEL VERTEBRAL UNI L MOD SED  04/04/2021     Social History:   reports that he has quit smoking. His smoking use included cigarettes. He has never used smokeless tobacco. He reports current alcohol use. He reports that he does not currently use drugs.   Family History:  His family history is not on file.   Allergies Allergies  Allergen Reactions   Other Nausea And Vomiting    Blue cheese    Shellfish Allergy Nausea And Vomiting    mussels     Home Medications  Prior to Admission medications   Medication Sig Start Date End Date Taking? Authorizing Provider  aspirin 325 MG EC tablet Take 325 mg by mouth daily. 03/22/21 03/22/22  [provider]  atorvastatin (LIPITOR) 40 MG tablet Take 40 mg by mouth daily. 03/22/21 04/21/21  [provider]  docusate sodium (COLACE) 100 MG capsule Take 200 mg by mouth at bedtime.    [provider]  Multiple Vitamin (MULTIVITAMIN WITH MINERALS) TABS tablet Take 1 tablet by mouth daily.    [provider]  nystatin ointment (MYCOSTATIN) Apply 1 application topically 2 (two) times daily. 03/30/21   Stoneking, Reece Leader., MD  senna (SENOKOT) 8.6 MG tablet Take 3 tablets by mouth in the morning. 02/25/21   [provider]   tamsulosin (FLOMAX) 0.4 MG CAPS capsule Take 0.4 mg by mouth daily.    [provider]  triamcinolone cream (KENALOG) 0.1 % Apply 1 application topically 2 (two) times daily. 02/06/21   [provider]     Critical care time: 84mins

## 2021-05-05 NOTE — Consult Note (Addendum)
Consultation  Referring Provider: Dr. Tamala Julian    Primary Care Physician:  Manon Hilding, MD Primary Gastroenterologist: Althia Forts       Reason for Consultation: Anemia            HPI:   William Leonard is a 75 y.o. male with a past medical history as listed below including diabetes and previous stroke on aspirin 325 mg daily, as well as urosepsis and left ureteral calculus (ureteral stent placed 01/30/2021) who presented to the emergency room at [facility] with generalized weakness, fevers and nausea which had started 2 days ago and got significantly worse over the past 12 hours prior to his arrival.    Today, the patient tells me that he has been having trouble ever since being started on antibiotics for his urinary tract infection.  Tells me that over the past week he has started to notice some darker than normal stools.  He is not sure that they were "black", but they were definitely darker than normal.  This is maybe once every couple of days which seems to be his normal bowel habit pattern.  Along with this has also been experiencing some nausea but no vomiting.  Tells me he has also been unable to eat fruit because this seems to bring on epigastric discomfort.  He has completely avoided fruit of any kind because of the pain that ensues, worse over the past couple of weeks.    Tells me that he followed with a GI doctor in [place] and has had a colonoscopy about once every 5 years.  At his last one, he was told that he would never need to repeat given his age.    Denies weight loss or seeing bright red blood in his stools.  ED course: Hemoglobin 6.7 (baseline 8), mild metabolic derangements on CMP, no significant leukocytosis; CT AP with contrast shows duodenitis as well as a left ureteral stent that is in place with possible findings and periureteral stranding and possible infection  GI history: Colonoscopies in [place], we do not have records.  Past Medical History:  Diagnosis Date   BPH  with urinary obstruction    Diabetes mellitus without complication ([place])    Hyperlipidemia    Stroke ([place]) 01/2021    Past Surgical History:  Procedure Laterality Date   CYSTOSCOPY     IR 3D INDEPENDENT WKST  04/04/2021   IR ANGIO INTRA EXTRACRAN SEL INTERNAL CAROTID BILAT MOD SED  04/04/2021   IR ANGIO VERTEBRAL SEL VERTEBRAL UNI L MOD SED  04/04/2021   Family history: No GI cancers  Social History   Tobacco Use   Smoking status: Former    Types: Cigarettes   Smokeless tobacco: Never  Substance Use Topics   Alcohol use: Yes   Drug use: Not Currently    Prior to Admission medications   Medication Sig Start Date End Date Taking? Authorizing Provider  aspirin 325 MG EC tablet Take 325 mg by mouth daily. 03/22/21 03/22/22 Yes [provider]  docusate sodium (COLACE) 100 MG capsule Take 300 mg by mouth at bedtime.   Yes [provider]  Multiple Vitamin (MULTIVITAMIN WITH MINERALS) TABS tablet Take 1 tablet by mouth daily.   Yes [provider]  nystatin ointment (MYCOSTATIN) Apply 1 application topically 2 (two) times daily. 03/30/21  Yes Stoneking, Reece Leader., MD  tamsulosin (FLOMAX) 0.4 MG CAPS capsule Take 0.4 mg by mouth every other day.   Yes [provider]  triamcinolone cream (KENALOG) 0.1 % Apply 1 application topically daily. 02/06/21  Yes [provider]    Current Facility-Administered Medications  Medication Dose Route Frequency Provider Last Rate Last Admin   0.9 %  sodium chloride infusion  250 mL Intravenous Continuous Horton, Alvin Critchley, DO   Held at 05/05/21 1029   ceFEPIme (MAXIPIME) 2 g in sodium chloride 0.9 % 100 mL IVPB  2 g Intravenous Q12H Pham, Minh Q, RPH-CPP 200 mL/hr at 05/05/21 1029 2 g at 05/05/21 1029   Chlorhexidine Gluconate Cloth 2 % PADS 6 each  6 each Topical Q0600 Horton, Alvin Critchley, DO   6 each at 05/05/21 0200   docusate sodium (COLACE) capsule 100 mg  100 mg Oral BID PRN Lestine Mount, PA-C        lactated ringers infusion   Intravenous Continuous Lorelle Gibbs, DO 150 mL/hr at 05/04/21 1954 New Bag at 05/04/21 1954   norepinephrine (LEVOPHED) 4mg  in 250mL (0.016 mg/mL) premix infusion  2-10 mcg/min Intravenous Titrated Lorelle Gibbs, DO 11.25 mL/hr at 05/05/21 0751 3 mcg/min at 05/05/21 0751   pantoprazole (PROTONIX) 80 mg /NS 100 mL IVPB  80 mg Intravenous Once Candee Furbish, MD       [START ON 05/08/2021] pantoprazole (PROTONIX) injection 40 mg  40 mg Intravenous Q12H Candee Furbish, MD       pantoprozole (PROTONIX) 80 mg /NS 100 mL infusion  8 mg/hr Intravenous Continuous Candee Furbish, MD       polyethylene glycol (MIRALAX / GLYCOLAX) packet 17 g  17 g Oral Daily PRN Nevada Crane M, PA-C        Allergies as of 05/04/2021 - Review Complete 05/04/2021  Allergen Reaction Noted   Other Nausea And Vomiting 01/30/2021   Shellfish allergy Nausea And Vomiting 01/30/2021   Review of Systems:    Constitutional: No weight loss, fever or chills Skin: No rash Cardiovascular: No chest pain  Respiratory: No SOB Gastrointestinal: See HPI and otherwise negative Genitourinary: No dysuria  Neurological: No headache, dizziness or syncope Musculoskeletal: No new muscle or joint pain Hematologic: No bleeding or bruising Psychiatric: No history of depression or anxiety    Physical Exam:  Vital signs in last 24 hours: Temp:  [97.7 F (36.5 C)-102.6 F (39.2 C)] 98.4 F (36.9 C) (12/09 1030) Pulse Rate:  [83-129] 88 (12/09 1030) Resp:  [12-37] 21 (12/09 1030) BP: (70-121)/(47-87) 112/70 (12/09 1030) SpO2:  [88 %-100 %] 96 % (12/09 1030) Weight:  [107 kg] 107 kg (12/08 1915) Last BM Date:  (PTA) General:   Pleasant ill appearing, Caucasian male appears to be in NAD, Well developed, Well nourished, alert and cooperative Head:  Normocephalic and atraumatic. Eyes:   PEERL, EOMI. No icterus. Conjunctiva pink. Ears:  Normal auditory acuity. Neck:  Supple Throat: Oral cavity  and pharynx without inflammation, swelling or lesion.  Lungs: Respirations even and unlabored. Lungs clear to auscultation bilaterally.   No wheezes, crackles, or rhonchi.  Heart: Normal S1, S2. No MRG. Regular rate and rhythm. + 2 edema b/l LE Abdomen:  Soft, mild distension, nontender. No rebound or guarding. Normal bowel sounds. No appreciable masses or hepatomegaly. Rectal:  Not performed.  Msk:  Symmetrical without gross deformities. Peripheral pulses intact.  Extremities:  No deformity or joint abnormality. Neurologic:  Alert and  oriented x4;  grossly normal neurologically.  Skin:   Dry and intact without significant lesions or rashes. Psychiatric: Demonstrates good judgement and reason without abnormal affect  or behaviors.  LAB RESULTS: Recent Labs    05/04/21 1937 05/05/21 0744  WBC 5.9 6.8  HGB 6.7* 5.9*  HCT 21.3* 19.0*  PLT 207 206   BMET Recent Labs    05/04/21 1937 05/05/21 0744  NA 134* 136  K 3.3* 3.6  CL 101 104  CO2 20* 22  GLUCOSE 159* 101*  BUN 28* 22  CREATININE 1.38* 1.21  CALCIUM 8.3* 8.2*   LFT Recent Labs    05/04/21 1937  PROT 6.7  ALBUMIN 3.1*  AST 57*  ALT 64*  ALKPHOS 277*  BILITOT 0.2*   PT/INR Recent Labs    05/04/21 1937  LABPROT 14.8  INR 1.2    STUDIES: CT Abdomen Pelvis Wo Contrast  Result Date: 05/04/2021 CLINICAL DATA:  Abdominal pain, acute, nonlocalized sepsis, abdominal pain with nausea/vomiting, low GFR EXAM: CT ABDOMEN AND PELVIS WITHOUT CONTRAST TECHNIQUE: Multidetector CT imaging of the abdomen and pelvis was performed following the standard protocol without IV contrast. COMPARISON:  None. FINDINGS: Lower chest: Bilateral lower lobe atelectasis. Interlobular septal wall thickening. Severe mitral annular calcifications. Findings suggestive of anemia. Hepatobiliary: Redemonstration of a approximately 3.5 cm hypodense lesion within left hepatic lobe. Otherwise no focal liver abnormality. Punctate calcified stone within  the gallbladder lumen. No gallbladder wall thickening or pericholecystic fluid. No biliary dilatation. Pancreas: No focal lesion. Normal pancreatic contour. No surrounding inflammatory changes. No main pancreatic ductal dilatation. Spleen: Normal in size without focal abnormality. Adrenals/Urinary Tract: No adrenal nodule bilaterally. Left ureteral stent with proximal pigtail within the left renal pelvis and distal pigtail within the urinary bladder lumen. Associated mild hydroureter with periureteral fat stranding. No left ureterolithiasis. No left nephrolithiasis and no left hydronephrosis. A 2.8 cm fluid density lesion within the right kidney likely represents a simple renal cyst. No right ureterolithiasis or hydroureter. Punctate right nephrolithiasis. No right hydroureter. 4 mm calcified stone within the urinary bladder lumen. The urinary bladder is unremarkable. Stomach/Bowel: Stomach is within normal limits. Bowel thickening in adjacent fat stranding of the first and second portion of the duodenum. No pneumatosis. No evidence of large bowel wall thickening or dilatation. Scattered colonic diverticulosis. Under distended rectum. Appendix appears normal. Vascular/Lymphatic: No abdominal aorta or iliac aneurysm. At least moderate atherosclerotic plaque of the aorta and its branches. A 1.3 cm aortocaval lymph node is noted (2:39). Several prominent upper abdominal lymph nodes are noted. Likely borderline enlarged 1.1 cm porta hepatic lymph node (2:31) no pelvic or inguinal lymphadenopathy. Reproductive: Prostate is unremarkable. Other: No intraperitoneal free fluid. No intraperitoneal free gas. No organized fluid collection. Musculoskeletal: No abdominal wall hernia or abnormality. No suspicious lytic or blastic osseous lesions. No acute displaced fracture. Levoscoliosis centered at the L3-L4 level with associated degenerative changes. IMPRESSION: 1. Duodenitis of the first and second portion of the duodenum. No  associated finding of perforation. Underlying ulcer not excluded. Underlying malignancy not excluded. Markedly limited evaluation for ischemia on this noncontrast study with no pneumatosis identified. 2. Likely reactive upper abdominal and retroperitoneal lymph nodes. Recommend attention on follow-up. 3. Left ureteral stent with proximal pigtail within the left renal pelvis and distal pigtail within the urinary bladder lumen. Associated mild hydroureter with periureteral fat stranding. Correlate with urinalysis for infection. 4. Cholelithiasis no CT findings of acute cholecystitis. 5. Scattered colonic diverticulosis with no acute diverticulitis. 6. A 4 mm calcified stone within the urinary bladder lumen. 7.  Aortic Atherosclerosis (ICD10-I70.0). Electronically Signed   By: Iven Finn M.D.   On: 05/04/2021 22:42  DG Chest Port 1 View  Result Date: 05/04/2021 CLINICAL DATA:  Questionable sepsis. EXAM: PORTABLE CHEST 1 VIEW COMPARISON:  Chest x-ray 03/19/2021. FINDINGS: The cardiac silhouette is mildly enlarged, unchanged. There are diffuse mild interstitial opacities throughout both lungs. Costophrenic angles are clear. No pneumothorax. No acute fractures. IMPRESSION: 1. Cardiomegaly with likely mild interstitial edema. Infection can not be excluded. Electronically Signed   By: Ronney Asters M.D.   On: 05/04/2021 20:34     Impression / Plan:   Impression: 1.  Septic shock: Acute pyelonephritis, continues on very low-dose Levophed and on broad-spectrum antibiotics 2.  Anemia: Hemoglobin 6.7 (8.6 on 04/04/21, 11.2 on 03/19/21) --> 5.9 -->2 unit PRBCs ordered, CT as above with the duodenitis of the first and second portion of the duodenum, underlying ulcer not excluded, patient describes stool as "darker than usual" and admits to some nausea and vomiting, BUN remains normal; consider upper GI bleed most likely 3.  Urosepsis 4.  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  Mild AKI  Plan: 1.  Continue Pantoprazole infusion 2.   Discussed possible EGD as a first step in evaluation.  Timing per Dr. Loletha Carrow..  Did discuss risks, benefits, limitations and alternatives and patient agrees to proceed.  Certainly given that patient is still on pressors in the ICU if he shows no signs of acute GI bleed we may want to wait until he stabilizes. 3.  For now can continue regular diet 4.  Continue to monitor hemoglobin with  transfusion as needed less than 7 5.  Continue antibiotics for urosepsis  Thank you for your kind consultation, we will continue to follow.  Lavone Nian Lemmon  05/05/2021, 11:01 AM  I have reviewed the entire case in detail with the above APP and discussed the plan in detail.  Therefore, I agree with the diagnoses recorded above. In addition,  I have personally interviewed and examined the patient and have personally reviewed any abdominal/pelvic CT scan images.  My additional thoughts are as follows:  Acute on chronic normocytic anemia with no known prior work-up, worsened after volume resuscitation for urosepsis, also currently requiring pressors. Patient had some nausea and vomiting over the last few days, most likely coinciding with his evolving urosepsis.  No chronic digestive complaints, no overt GI bleeding at home or in hospital. CT scan images personally reviewed, and they are limited by lack of oral and IV contrast.  That makes it difficult to determine with certainty if there is true wall thickening/inflammation of the duodenum.  Recommendation is for an upper endoscopy prior to discharge, but not currently planning that for tomorrow.  Patient needs further treatment of urosepsis and to be weaned off pressors with a period of stability prior to sedation for a nonurgent endoscopic procedure. He is receiving 2 units of PRBCs today, he can be changed to split dose oral PPI dosing, and we will follow.    Nelida Meuse  III Office:[phone]

## 2021-05-05 NOTE — ED Notes (Signed)
Update given to wife

## 2021-05-05 NOTE — Progress Notes (Signed)
An USGPIV (ultrasound guided PIV) has been placed for short-term vasopressor infusion. Should this treatment be needed beyond 48hours, best practice supports seeking central line access.  It will be the responsibility of the bedside nurse to follow best practice to prevent extravasations including placing the IVWatch when indicated.   

## 2021-05-05 NOTE — Progress Notes (Signed)
[facility] Progress Note Patient Name: William Leonard DOB: [birth year] MRN: [id number]   Date of Service  05/05/2021  HPI/Events of Note  Pt admitted with septic shock  eICU Interventions  On low dose levophed; map is at goal     Intervention Category Evaluation Type: New Patient Evaluation  Tilden Dome 05/05/2021, 3:38 AM

## 2021-05-05 NOTE — Progress Notes (Signed)
Seen and examined.  Given CT and anemia I wonder if there is a UGIB on top of possible sepsis  Remains on pressors  Does note his stool is darker than usual, admits also to N/V but no hematochezia or coffee ground.  Abdominal exam benign, he does appear pale, BUN not markedly elevated so suspect this is a more chronic process  Urine studies are still pending, continue cefepime, do not see role for vanc  Start PPI, consult GI, NPO but suspect he may not need scope until tomorrow  Additional 35 min cc time

## 2021-05-06 ENCOUNTER — Inpatient Hospital Stay (HOSPITAL_COMMUNITY): Payer: Medicare HMO

## 2021-05-06 DIAGNOSIS — R933 Abnormal findings on diagnostic imaging of other parts of digestive tract: Secondary | ICD-10-CM | POA: Diagnosis not present

## 2021-05-06 DIAGNOSIS — R579 Shock, unspecified: Secondary | ICD-10-CM

## 2021-05-06 DIAGNOSIS — D649 Anemia, unspecified: Secondary | ICD-10-CM | POA: Diagnosis not present

## 2021-05-06 LAB — BPAM RBC
Blood Product Expiration Date: 202301022359
Blood Product Expiration Date: 202301022359
ISSUE DATE / TIME: 202212090801
ISSUE DATE / TIME: 202212091123
Unit Type and Rh: 5100
Unit Type and Rh: 5100

## 2021-05-06 LAB — TYPE AND SCREEN
ABO/RH(D): O POS
Antibody Screen: NEGATIVE
Unit division: 0
Unit division: 0

## 2021-05-06 LAB — BASIC METABOLIC PANEL
Anion gap: 7 (ref 5–15)
BUN: 20 mg/dL (ref 8–23)
CO2: 24 mmol/L (ref 22–32)
Calcium: 8.5 mg/dL — ABNORMAL LOW (ref 8.9–10.3)
Chloride: 108 mmol/L (ref 98–111)
Creatinine, Ser: 1.04 mg/dL (ref 0.61–1.24)
GFR, Estimated: >60 mL/min (ref >60)
Glucose, Bld: 133 mg/dL — ABNORMAL HIGH (ref 70–99)
Potassium: 3.6 mmol/L (ref 3.5–5.1)
Sodium: 139 mmol/L (ref 135–145)

## 2021-05-06 LAB — ECHOCARDIOGRAM COMPLETE
AR max vel: 2.31 cm2
AV Area VTI: 2.49 cm2
AV Area mean vel: 2.36 cm2
AV Mean grad: 9 mmHg
AV Peak grad: 16.2 mmHg
Ao pk vel: 2.01 m/s
Area-P 1/2: 3.42 cm2
Height: 74 in
S' Lateral: 3.3 cm
Weight: 3911.84 oz

## 2021-05-06 LAB — CBC
HCT: 24.5 % — ABNORMAL LOW (ref 39.0–52.0)
Hemoglobin: 7.8 g/dL — ABNORMAL LOW (ref 13.0–17.0)
MCH: 27.2 pg (ref 26.0–34.0)
MCHC: 31.8 g/dL (ref 30.0–36.0)
MCV: 85.4 fL (ref 80.0–100.0)
Platelets: 247 10*3/uL (ref 150–400)
RBC: 2.87 MIL/uL — ABNORMAL LOW (ref 4.22–5.81)
RDW: 15.7 % — ABNORMAL HIGH (ref 11.5–15.5)
WBC: 8.8 10*3/uL (ref 4.0–10.5)
nRBC: 0 % (ref 0.0–0.2)

## 2021-05-06 LAB — CORTISOL: Cortisol, Plasma: 16.4 ug/dL

## 2021-05-06 MED ORDER — SODIUM CHLORIDE 0.9 % IV SOLN
2.0000 g | Freq: Once | INTRAVENOUS | Status: AC
  Administered 2021-05-06: 2 g via INTRAVENOUS

## 2021-05-06 MED ORDER — PERFLUTREN LIPID MICROSPHERE
1.0000 mL | INTRAVENOUS | Status: AC | PRN
Start: 2021-05-06 — End: 2021-05-06
  Administered 2021-05-06: 2 mL via INTRAVENOUS
  Filled 2021-05-06: qty 10

## 2021-05-06 MED ORDER — SODIUM CHLORIDE 0.9 % IV SOLN
2.0000 g | Freq: Three times a day (TID) | INTRAVENOUS | Status: DC
  Administered 2021-05-06 – 2021-05-08 (×6): 2 g via INTRAVENOUS
  Filled 2021-05-06 (×6): qty 2

## 2021-05-06 MED ORDER — PANTOPRAZOLE SODIUM 40 MG PO TBEC
40.0000 mg | DELAYED_RELEASE_TABLET | Freq: Two times a day (BID) | ORAL | Status: DC
  Administered 2021-05-06 – 2021-05-09 (×7): 40 mg via ORAL
  Filled 2021-05-06 (×7): qty 1

## 2021-05-06 NOTE — Progress Notes (Addendum)
Gastroenterology Progress Note  CC:  Anemia  Subjective:  Feels ok.  No new complaints.  Just had an ECHO.  No BM or sign of GI bleeding.  Objective:  Vital signs in last 24 hours: Temp:  [97.6 F (36.4 C)-98.9 F (37.2 C)] 97.6 F (36.4 C) (12/10 1111) Pulse Rate:  [77-107] 91 (12/10 0400) Resp:  [16-36] 20 (12/10 0400) BP: (93-137)/(58-102) 93/58 (12/10 0400) SpO2:  [86 %-98 %] 98 % (12/10 0400) Weight:  [110.9 kg] 110.9 kg (12/10 0500) Last BM Date:  (pta) General:  Alert, chronically ill-appearing, in NAD. Heart:  Regular rate and rhythm; no murmurs Pulm:  End expiratory wheezing noted. Abdomen:  Soft, non-distended.  BS present.  Non-tender.  Extremities:  Without edema. Neurologic:  Alert and  oriented x 4;  grossly normal neurologically. Rectal: Light brown stool Intake/Output from previous day: 12/09 0701 - 12/10 0700 In: 1994.1 [P.O.:580; I.V.:554.1; Blood:660; IV Piggyback:200.1] Out: 2450 [Urine:2450]  Lab Results: Recent Labs    05/04/21 1937 05/05/21 0744 05/05/21 1715 05/06/21 0735  WBC 5.9 6.8  --  8.8  HGB 6.7* 5.9* 7.7* 7.8*  HCT 21.3* 19.0* 23.4* 24.5*  PLT 207 206  --  247   BMET Recent Labs    05/04/21 1937 05/05/21 0744 05/06/21 0735  NA 134* 136 139  K 3.3* 3.6 3.6  CL 101 104 108  CO2 20* 22 24  GLUCOSE 159* 101* 133*  BUN 28* 22 20  CREATININE 1.38* 1.21 1.04  CALCIUM 8.3* 8.2* 8.5*   LFT Recent Labs    05/04/21 1937  PROT 6.7  ALBUMIN 3.1*  AST 57*  ALT 64*  ALKPHOS 277*  BILITOT 0.2*   PT/INR Recent Labs    05/04/21 1937  LABPROT 14.8  INR 1.2   CT Abdomen Pelvis Wo Contrast  Result Date: 05/04/2021 CLINICAL DATA:  Abdominal pain, acute, nonlocalized sepsis, abdominal pain with nausea/vomiting, low GFR EXAM: CT ABDOMEN AND PELVIS WITHOUT CONTRAST TECHNIQUE: Multidetector CT imaging of the abdomen and pelvis was performed following the standard protocol without IV contrast. COMPARISON:  None.  FINDINGS: Lower chest: Bilateral lower lobe atelectasis. Interlobular septal wall thickening. Severe mitral annular calcifications. Findings suggestive of anemia. Hepatobiliary: Redemonstration of a approximately 3.5 cm hypodense lesion within left hepatic lobe. Otherwise no focal liver abnormality. Punctate calcified stone within the gallbladder lumen. No gallbladder wall thickening or pericholecystic fluid. No biliary dilatation. Pancreas: No focal lesion. Normal pancreatic contour. No surrounding inflammatory changes. No main pancreatic ductal dilatation. Spleen: Normal in size without focal abnormality. Adrenals/Urinary Tract: No adrenal nodule bilaterally. Left ureteral stent with proximal pigtail within the left renal pelvis and distal pigtail within the urinary bladder lumen. Associated mild hydroureter with periureteral fat stranding. No left ureterolithiasis. No left nephrolithiasis and no left hydronephrosis. A 2.8 cm fluid density lesion within the right kidney likely represents a simple renal cyst. No right ureterolithiasis or hydroureter. Punctate right nephrolithiasis. No right hydroureter. 4 mm calcified stone within the urinary bladder lumen. The urinary bladder is unremarkable. Stomach/Bowel: Stomach is within normal limits. Bowel thickening in adjacent fat stranding of the first and second portion of the duodenum. No pneumatosis. No evidence of large bowel wall thickening or dilatation. Scattered colonic diverticulosis. Under distended rectum. Appendix appears normal. Vascular/Lymphatic: No abdominal aorta or iliac aneurysm. At least moderate atherosclerotic plaque of the aorta and its branches. A 1.3 cm aortocaval lymph node is noted (2:39). Several prominent upper abdominal lymph nodes are noted. Likely  borderline enlarged 1.1 cm porta hepatic lymph node (2:31) no pelvic or inguinal lymphadenopathy. Reproductive: Prostate is unremarkable. Other: No intraperitoneal free fluid. No intraperitoneal  free gas. No organized fluid collection. Musculoskeletal: No abdominal wall hernia or abnormality. No suspicious lytic or blastic osseous lesions. No acute displaced fracture. Levoscoliosis centered at the L3-L4 level with associated degenerative changes. IMPRESSION: 1. Duodenitis of the first and second portion of the duodenum. No associated finding of perforation. Underlying ulcer not excluded. Underlying malignancy not excluded. Markedly limited evaluation for ischemia on this noncontrast study with no pneumatosis identified. 2. Likely reactive upper abdominal and retroperitoneal lymph nodes. Recommend attention on follow-up. 3. Left ureteral stent with proximal pigtail within the left renal pelvis and distal pigtail within the urinary bladder lumen. Associated mild hydroureter with periureteral fat stranding. Correlate with urinalysis for infection. 4. Cholelithiasis no CT findings of acute cholecystitis. 5. Scattered colonic diverticulosis with no acute diverticulitis. 6. A 4 mm calcified stone within the urinary bladder lumen. 7.  Aortic Atherosclerosis (ICD10-I70.0). Electronically Signed   By: Iven Finn M.D.   On: 05/04/2021 22:42   DG Chest Port 1 View  Result Date: 05/04/2021 CLINICAL DATA:  Questionable sepsis. EXAM: PORTABLE CHEST 1 VIEW COMPARISON:  Chest x-ray 03/19/2021. FINDINGS: The cardiac silhouette is mildly enlarged, unchanged. There are diffuse mild interstitial opacities throughout both lungs. Costophrenic angles are clear. No pneumothorax. No acute fractures. IMPRESSION: 1. Cardiomegaly with likely mild interstitial edema. Infection can not be excluded. Electronically Signed   By: Ronney Asters M.D.   On: 05/04/2021 20:34    Assessment / Plan: 1.  Septic shock: Acute pyelonephritis, continues on very low-dose Levophed (nurse reports that she just turned it off to see how he does) and on broad-spectrum antibiotics. 2.  Anemia: Hemoglobin 6.7 (8.6 on 04/04/21, 11.2 on 03/19/21) -->  5.9 -->2 unit PRBCs ordered, CT as above with the duodenitis of the first and second portion of the duodenum, underlying ulcer not excluded, patient describes stool as "darker than usual" and admits to some nausea and vomiting, BUN remains normal; consider upper GI bleed most likely.  Hgb 7.8 grams today.  Light brown stool on my rectal exam today. 3.  Urosepsis:  On abx. 4.  History of CVA: Intracerebral aneurysm of the right middle cerebral artery. 5.  Mild AKI:  Resolved.  -Pantoprazole changed to 40 mg PO BID.  -Monitor Hgb and transfuse further prn. -Possible EGD at some point during this hospital stay.   LOS: 2 days   William Leonard. Zehr  05/06/2021, 1:43 PM   I have discussed the case with the APP, and that is the plan I formulated. I personally interviewed and examined the patient.  Patient looks well.  Pressors were stopped this afternoon, so far blood pressure heart rate stable He just had an echocardiogram. Eating well without abdominal pain, nausea or vomiting. Urosepsis under control.  Plan for upper endoscopy prior to hospital discharge to evaluate finding of possible duodenitis on noncontrast CT abdomen. (Not tomorrow, possibly Monday) Nelida Meuse III Office: [phone]

## 2021-05-06 NOTE — Progress Notes (Signed)
  Echocardiogram 2D Echocardiogram has been performed.  William Leonard F 05/06/2021, 2:47 PM

## 2021-05-06 NOTE — Progress Notes (Addendum)
   NAME:  William Leonard, MRN:  [id number], DOB:  [birth year], LOS: 2 ADMISSION DATE:  05/04/2021, CONSULTATION DATE:  05/05/2021 REFERRING MD: [facility], CHIEF COMPLAINT: Generalized weakness  Brief History:  This is a 75 year old white male who presents with some combination urosepsis and GIB requiring transfusion and pressors.  Pertinent  Medical History  Acute pyelonephritis Recurrent urosepsis  Intracerebral aneurysm of the right middle cerebral artery Diabetes mellitus Hyperlipidemia CVA  Significant Hospital Events: Including procedures, antibiotic start and stop dates in addition to other pertinent events   12/9 admitted, GI consult   Subjective    No events.  Remains on pressors for some reason. Denies N/V or any BM. No abd pain or flank pain or dysuria.  Objective   Blood pressure (!) 93/58, pulse 91, temperature 98.3 F (36.8 C), temperature source Oral, resp. rate 20, height 6\' 2"  (1.88 m), weight 110.9 kg, SpO2 98 %.        Intake/Output Summary (Last 24 hours) at 05/06/2021 0646 Last data filed at 05/06/2021 8592 Gross per 24 hour  Intake 1994.11 ml  Output 2450 ml  Net -455.89 ml    Filed Weights   05/04/21 1915 05/06/21 0500  Weight: 107 kg 110.9 kg    Examination: Constitutional: no distress  Eyes: EOMI, pupils equal  Ears, nose, mouth, and throat: MMM, trachea midline Cardiovascular: RRR, ext warm Respiratory: clear, no wheezing Gastrointestinal: soft, nontender, +BS Skin: No rashes, normal turgor Ext: no edema Neurologic: moves all 4 ext Psychiatric: fair inisght  Urine actually seems pretty bland on UA Labs pending  Assessment & Plan:  Shock secondary to some combination of GIB and/or UTI; his urine is actually pretty bland; abnormal duodenum on CT question PUD  S/P ureteral stent 01/30/21, UTI E coli 10/27  Hx DM, sugars okay here on BMP, will hold off on SSI  R MCA aneurysm- was to have clipping 12/15, may need to  touch base with NSGY on timing prior to DC  - Continue PPI, f/u AM H/H, eventual EGD prior to DC - Continue cefepime, f/u urine culture - For lingering shock, check cortisol, limited echo - Wean levophed for MAP 60  Best Practice (right click and "Reselect all SmartList Selections" daily)   Diet/type: diabetic DVT prophylaxis: LMWH GI prophylaxis: PPI Lines: N/A Foley:  N/A Code Status:  full code Last date of multidisciplinary goals of care discussion []    Critical care time: 32 mins

## 2021-05-07 DIAGNOSIS — K2980 Duodenitis without bleeding: Secondary | ICD-10-CM

## 2021-05-07 DIAGNOSIS — Z87442 Personal history of urinary calculi: Secondary | ICD-10-CM

## 2021-05-07 DIAGNOSIS — R6521 Severe sepsis with septic shock: Secondary | ICD-10-CM

## 2021-05-07 DIAGNOSIS — D500 Iron deficiency anemia secondary to blood loss (chronic): Secondary | ICD-10-CM

## 2021-05-07 DIAGNOSIS — I671 Cerebral aneurysm, nonruptured: Secondary | ICD-10-CM

## 2021-05-07 DIAGNOSIS — R933 Abnormal findings on diagnostic imaging of other parts of digestive tract: Secondary | ICD-10-CM | POA: Diagnosis not present

## 2021-05-07 DIAGNOSIS — N10 Acute tubulo-interstitial nephritis: Secondary | ICD-10-CM

## 2021-05-07 DIAGNOSIS — E876 Hypokalemia: Secondary | ICD-10-CM

## 2021-05-07 DIAGNOSIS — D649 Anemia, unspecified: Secondary | ICD-10-CM | POA: Diagnosis not present

## 2021-05-07 DIAGNOSIS — A419 Sepsis, unspecified organism: Principal | ICD-10-CM

## 2021-05-07 LAB — CBC
HCT: 23.9 % — ABNORMAL LOW (ref 39.0–52.0)
Hemoglobin: 7.4 g/dL — ABNORMAL LOW (ref 13.0–17.0)
MCH: 26.9 pg (ref 26.0–34.0)
MCHC: 31 g/dL (ref 30.0–36.0)
MCV: 86.9 fL (ref 80.0–100.0)
Platelets: 246 10*3/uL (ref 150–400)
RBC: 2.75 MIL/uL — ABNORMAL LOW (ref 4.22–5.81)
RDW: 16.2 % — ABNORMAL HIGH (ref 11.5–15.5)
WBC: 5.8 10*3/uL (ref 4.0–10.5)
nRBC: 0 % (ref 0.0–0.2)

## 2021-05-07 LAB — BASIC METABOLIC PANEL
Anion gap: 7 (ref 5–15)
BUN: 18 mg/dL (ref 8–23)
CO2: 26 mmol/L (ref 22–32)
Calcium: 8.4 mg/dL — ABNORMAL LOW (ref 8.9–10.3)
Chloride: 106 mmol/L (ref 98–111)
Creatinine, Ser: 0.99 mg/dL (ref 0.61–1.24)
GFR, Estimated: >60 mL/min (ref >60)
Glucose, Bld: 104 mg/dL — ABNORMAL HIGH (ref 70–99)
Potassium: 3.3 mmol/L — ABNORMAL LOW (ref 3.5–5.1)
Sodium: 139 mmol/L (ref 135–145)

## 2021-05-07 LAB — URINE CULTURE: Culture: NO GROWTH

## 2021-05-07 MED ORDER — SODIUM CHLORIDE 0.9 % IV SOLN: 250.0000 mL | INTRAVENOUS | Status: DC | PRN

## 2021-05-07 NOTE — Plan of Care (Signed)

## 2021-05-07 NOTE — Progress Notes (Addendum)
[facility] Progress Note  CC:  Anemia  Subjective:  Feels much better.  Sitting up in chair.  Has been eating well.  No BM.  No SOB.  Objective:  Vital signs in last 24 hours: Temp:  [97.8 F (36.6 C)-98.3 F (36.8 C)] 98.1 F (36.7 C) (12/11 0844) Pulse Rate:  [78-106] 95 (12/11 0844) Resp:  [17-30] 30 (12/11 0844) BP: (73-124)/(47-75) 116/63 (12/11 0844) SpO2:  [90 %-98 %] 93 % (12/11 0844) Weight:  [110.5 kg] 110.5 kg (12/11 0412) Last BM Date:  (pta) General:  Alert, elderly male, in NAD Heart:  Regular rate and rhythm; no murmurs Pulm:  CTAB.  No W/R/R. Abdomen:  Soft, non-distended.  BS present.  Non-tender. Extremities:  Without edema. Neurologic:  Alert and oriented x 4;  grossly normal neurologically. Psych:  Alert and cooperative. Normal mood and affect.  Intake/Output from previous day: 12/10 0701 - 12/11 0700 In: 1482.5 [P.O.:1250; I.V.:132.5; IV Piggyback:100] Out: 800 [Urine:800] Intake/Output this shift: Total I/O In: 150 [P.O.:150] Out: -   Lab Results: Recent Labs    05/05/21 0744 05/05/21 1715 05/06/21 0735 05/07/21 0110  WBC 6.8  --  8.8 5.8  HGB 5.9* 7.7* 7.8* 7.4*  HCT 19.0* 23.4* 24.5* 23.9*  PLT 206  --  247 246   BMET Recent Labs    05/05/21 0744 05/06/21 0735 05/07/21 0110  NA 136 139 139  K 3.6 3.6 3.3*  CL 104 108 106  CO2 22 24 26   GLUCOSE 101* 133* 104*  BUN 22 20 18   CREATININE 1.21 1.04 0.99  CALCIUM 8.2* 8.5* 8.4*   LFT Recent Labs    05/04/21 1937  PROT 6.7  ALBUMIN 3.1*  AST 57*  ALT 64*  ALKPHOS 277*  BILITOT 0.2*   PT/INR Recent Labs    05/04/21 1937  LABPROT 14.8  INR 1.2   ECHOCARDIOGRAM COMPLETE  Result Date: 05/06/2021    ECHOCARDIOGRAM REPORT   Patient Name:   William Leonard Date of Exam: 05/06/2021 Medical Rec #:  [id number]        Height:       74.0 in Accession #:    [id number]       Weight:       244.5 lb Date of Birth:  [birth year]         BSA:          2.367 m Patient  Age:    75 years         BP:           106/63 mmHg Patient Gender: M                HR:           86 bpm. Exam Location:  Inpatient Procedure: 2D Echo, Cardiac Doppler, Color Doppler and Intracardiac            Opacification Agent Indications:     Cardiomegaly I51.7  History:         Patient has no prior history of Echocardiogram examinations.                  Signs/Symptoms:Shortness of Breath and Hypotension.  Sonographer:     Merrie Roof RDCS Referring Phys:  [id number] Candee Furbish Diagnosing Phys: Dixie Dials MD IMPRESSIONS  1. Left ventricular ejection fraction, by estimation, is 55 to 60%. The left ventricle has normal function. The left ventricle has no regional wall motion abnormalities. There is  mild concentric left ventricular hypertrophy. Left ventricular diastolic parameters are consistent with Grade I diastolic dysfunction (impaired relaxation).  2. Right ventricular systolic function is normal. The right ventricular size is normal.  3. Left atrial size was mildly dilated.  4. Right atrial size was mildly dilated.  5. The mitral valve is degenerative. Mild mitral valve regurgitation. Moderate mitral annular calcification.  6. The aortic valve is tricuspid. There is severe calcifcation of the aortic valve. There is moderate thickening of the aortic valve. Aortic valve regurgitation is not visualized. Mild aortic valve stenosis.  7. There is mild (Grade II) atheroma plaque involving the aortic root and ascending aorta.  8. The inferior vena cava is dilated in size with >50% respiratory variability, suggesting right atrial pressure of 8 mmHg. FINDINGS  Left Ventricle: Left ventricular ejection fraction, by estimation, is 55 to 60%. The left ventricle has normal function. The left ventricle has no regional wall motion abnormalities. The left ventricular internal cavity size was normal in size. There is  mild concentric left ventricular hypertrophy. Left ventricular diastolic parameters are consistent with  Grade I diastolic dysfunction (impaired relaxation). Right Ventricle: The right ventricular size is normal. No increase in right ventricular wall thickness. Right ventricular systolic function is normal. Left Atrium: Left atrial size was mildly dilated. Right Atrium: Right atrial size was mildly dilated. Pericardium: There is no evidence of pericardial effusion. Mitral Valve: The mitral valve is degenerative in appearance. There is mild thickening of the mitral valve leaflet(s). There is moderate calcification of the mitral valve leaflet(s). Moderate mitral annular calcification. Mild mitral valve regurgitation. Tricuspid Valve: The tricuspid valve is normal in structure. Tricuspid valve regurgitation is mild. Aortic Valve: The aortic valve is tricuspid. There is severe calcifcation of the aortic valve. There is moderate thickening of the aortic valve. There is moderate aortic valve annular calcification. Aortic valve regurgitation is not visualized. Mild aortic stenosis is present. Aortic valve mean gradient measures 9.0 mmHg. Aortic valve peak gradient measures 16.2 mmHg. Aortic valve area, by VTI measures 2.49 cm. Pulmonic Valve: The pulmonic valve was normal in structure. Pulmonic valve regurgitation is not visualized. Aorta: The aortic root is normal in size and structure. There is mild (Grade II) atheroma plaque involving the aortic root and ascending aorta. Venous: The inferior vena cava is dilated in size with greater than 50% respiratory variability, suggesting right atrial pressure of 8 mmHg. IAS/Shunts: The atrial septum is grossly normal.  LEFT VENTRICLE PLAX 2D LVIDd:         5.00 cm   Diastology LVIDs:         3.30 cm   LV e' medial:    8.16 cm/s LV PW:         1.20 cm   LV E/e' medial:  13.5 LV IVS:        1.20 cm   LV e' lateral:   7.07 cm/s LVOT diam:     2.20 cm   LV E/e' lateral: 15.6 LV SV:         90 LV SV Index:   38 LVOT Area:     3.80 cm  RIGHT VENTRICLE          IVC RV Basal diam:  4.10 cm   IVC diam: 2.90 cm RV Mid diam:    3.40 cm LEFT ATRIUM              Index        RIGHT ATRIUM  Index LA diam:        3.70 cm  1.56 cm/m   RA Area:     26.00 cm LA Vol (A2C):   111.0 ml 46.89 ml/m  RA Volume:   80.20 ml  33.88 ml/m LA Vol (A4C):   92.8 ml  39.20 ml/m LA Biplane Vol: 103.0 ml 43.51 ml/m  AORTIC VALVE AV Area (Vmax):    2.31 cm AV Area (Vmean):   2.36 cm AV Area (VTI):     2.49 cm AV Vmax:           201.00 cm/s AV Vmean:          134.000 cm/s AV VTI:            0.361 m AV Peak Grad:      16.2 mmHg AV Mean Grad:      9.0 mmHg LVOT Vmax:         122.00 cm/s LVOT Vmean:        83.300 cm/s LVOT VTI:          0.236 m LVOT/AV VTI ratio: 0.65  AORTA Ao Root diam: 3.60 cm MITRAL VALVE MV Area (PHT): 3.42 cm     SHUNTS MV Decel Time: 222 msec     Systemic VTI:  0.24 m MV E velocity: 110.00 cm/s  Systemic Diam: 2.20 cm MV A velocity: 118.00 cm/s MV E/A ratio:  0.93 Dixie Dials MD Electronically signed by Dixie Dials MD Signature Date/Time: 05/06/2021/3:38:57 PM    Final     Assessment / Plan: 1.  Septic shock: Acute pyelonephritis, off pressors, and on broad-spectrum antibiotics. 2.  Anemia: Hemoglobin 6.7 (8.6 on 04/04/21, 11.2 on 03/19/21) --> 5.9 -->2 unit PRBCs ordered, CT as above with the duodenitis of the first and second portion of the duodenum, underlying ulcer not excluded, patient describes stool as "darker than usual" and admits to some nausea and vomiting, BUN remains normal; consider upper GI bleed most likely.  Hgb 7.4 grams today.  Light brown stool on my rectal exam 12/10. 3.  Urosepsis:  On abx. 4.  History of CVA: Intracerebral aneurysm of the right middle cerebral artery. 5.  Mild AKI:  Resolved.   -Pantoprazole 40 mg PO BID.  -Monitor Hgb and transfuse further prn. -EGD 12/12 with Dr. Havery Moros.   LOS: 3 days   Laban Emperor. Zehr  05/07/2021, 11:14 AM   I have taken an interval history, reviewed the chart and examined the patient. I agree with the Advanced  Practitioner's note, impression and recommendations.  Majority the medical decision-making in the formulation of the assessment and plan were performed by me.  My additional thoughts are as follows:  Still no overt GI bleeding.  Uncertain if true finding on duodenum based on noncontrast CT imaging.  Chronic normocytic anemia, might be unrelated.  EGD tomorrow.  If negative, GI will sign off and patient needs outpatient hematology evaluation.   Nelida Meuse III [phone]

## 2021-05-07 NOTE — Progress Notes (Signed)
Pharmacy Antibiotic Note  William Leonard is a 75 y.o. male admitted on 05/04/2021 with  urosepsis .  Pharmacy originally consulted for vanc and cefepime dosing, but this has been narrowed to just cefepime. WBC wnl and Scr stable. Patient is afebrile. Patient is currently being worked up for possible GIB.   Plan: Continue Cefepime 2g IV q12 Monitor clinical status  F/u LOT plans    Height: 6\' 2"  (188 cm) Weight: 110.5 kg (243 lb 9.7 oz) IBW/kg (Calculated) : 82.2  Temp (24hrs), Avg:98 F (36.7 C), Min:97.6 F (36.4 C), Max:98.3 F (36.8 C)  Recent Labs  Lab 05/04/21 1937 05/04/21 2134 05/05/21 0744 05/06/21 0735 05/07/21 0110  WBC 5.9  --  6.8 8.8 5.8  CREATININE 1.38*  --  1.21 1.04 0.99  LATICACIDVEN 5.2* 3.1*  --   --   --      Estimated Creatinine Clearance: 82.6 mL/min (by C-G formula based on SCr of 0.99 mg/dL).    Allergies  Allergen Reactions   Other Nausea And Vomiting    Blue cheese    Shellfish Allergy Nausea And Vomiting    mussels    Antimicrobials this admission: 12/8 cefepime>> 12/8 vanc>>12/9   Dose adjustments this admission:   Microbiology results: 12/8 blood>> ngtd 12/9 MRSA PCR >> neg  Cephus Slater, PharmD, MBA Pharmacy Resident [phone] 05/07/2021 8:26 AM

## 2021-05-07 NOTE — H&P (View-Only) (Signed)
[facility] Progress Note  CC:  Anemia  Subjective:  Feels much better.  Sitting up in chair.  Has been eating well.  No BM.  No SOB.  Objective:  Vital signs in last 24 hours: Temp:  [97.8 F (36.6 C)-98.3 F (36.8 C)] 98.1 F (36.7 C) (12/11 0844) Pulse Rate:  [78-106] 95 (12/11 0844) Resp:  [17-30] 30 (12/11 0844) BP: (73-124)/(47-75) 116/63 (12/11 0844) SpO2:  [90 %-98 %] 93 % (12/11 0844) Weight:  [110.5 kg] 110.5 kg (12/11 0412) Last BM Date:  (pta) General:  Alert, elderly male, in NAD Heart:  Regular rate and rhythm; no murmurs Pulm:  CTAB.  No W/R/R. Abdomen:  Soft, non-distended.  BS present.  Non-tender. Extremities:  Without edema. Neurologic:  Alert and oriented x 4;  grossly normal neurologically. Psych:  Alert and cooperative. Normal mood and affect.  Intake/Output from previous day: 12/10 0701 - 12/11 0700 In: 1482.5 [P.O.:1250; I.V.:132.5; IV Piggyback:100] Out: 800 [Urine:800] Intake/Output this shift: Total I/O In: 150 [P.O.:150] Out: -   Lab Results: Recent Labs    05/05/21 0744 05/05/21 1715 05/06/21 0735 05/07/21 0110  WBC 6.8  --  8.8 5.8  HGB 5.9* 7.7* 7.8* 7.4*  HCT 19.0* 23.4* 24.5* 23.9*  PLT 206  --  247 246   BMET Recent Labs    05/05/21 0744 05/06/21 0735 05/07/21 0110  NA 136 139 139  K 3.6 3.6 3.3*  CL 104 108 106  CO2 22 24 26   GLUCOSE 101* 133* 104*  BUN 22 20 18   CREATININE 1.21 1.04 0.99  CALCIUM 8.2* 8.5* 8.4*   LFT Recent Labs    05/04/21 1937  PROT 6.7  ALBUMIN 3.1*  AST 57*  ALT 64*  ALKPHOS 277*  BILITOT 0.2*   PT/INR Recent Labs    05/04/21 1937  LABPROT 14.8  INR 1.2   ECHOCARDIOGRAM COMPLETE  Result Date: 05/06/2021    ECHOCARDIOGRAM REPORT   Patient Name:   William Leonard Date of Exam: 05/06/2021 Medical Rec #:  [id number]        Height:       74.0 in Accession #:    [id number]       Weight:       244.5 lb Date of Birth:  01-26-1946         BSA:          2.367 m Patient  Age:    75 years         BP:           106/63 mmHg Patient Gender: M                HR:           86 bpm. Exam Location:  Inpatient Procedure: 2D Echo, Cardiac Doppler, Color Doppler and Intracardiac            Opacification Agent Indications:     Cardiomegaly I51.7  History:         Patient has no prior history of Echocardiogram examinations.                  Signs/Symptoms:Shortness of Breath and Hypotension.  Sonographer:     Merrie Roof RDCS Referring Phys:  [id number] Candee Furbish Diagnosing Phys: Dixie Dials MD IMPRESSIONS  1. Left ventricular ejection fraction, by estimation, is 55 to 60%. The left ventricle has normal function. The left ventricle has no regional wall motion abnormalities. There is  mild concentric left ventricular hypertrophy. Left ventricular diastolic parameters are consistent with Grade I diastolic dysfunction (impaired relaxation).  2. Right ventricular systolic function is normal. The right ventricular size is normal.  3. Left atrial size was mildly dilated.  4. Right atrial size was mildly dilated.  5. The mitral valve is degenerative. Mild mitral valve regurgitation. Moderate mitral annular calcification.  6. The aortic valve is tricuspid. There is severe calcifcation of the aortic valve. There is moderate thickening of the aortic valve. Aortic valve regurgitation is not visualized. Mild aortic valve stenosis.  7. There is mild (Grade II) atheroma plaque involving the aortic root and ascending aorta.  8. The inferior vena cava is dilated in size with >50% respiratory variability, suggesting right atrial pressure of 8 mmHg. FINDINGS  Left Ventricle: Left ventricular ejection fraction, by estimation, is 55 to 60%. The left ventricle has normal function. The left ventricle has no regional wall motion abnormalities. The left ventricular internal cavity size was normal in size. There is  mild concentric left ventricular hypertrophy. Left ventricular diastolic parameters are consistent with  Grade I diastolic dysfunction (impaired relaxation). Right Ventricle: The right ventricular size is normal. No increase in right ventricular wall thickness. Right ventricular systolic function is normal. Left Atrium: Left atrial size was mildly dilated. Right Atrium: Right atrial size was mildly dilated. Pericardium: There is no evidence of pericardial effusion. Mitral Valve: The mitral valve is degenerative in appearance. There is mild thickening of the mitral valve leaflet(s). There is moderate calcification of the mitral valve leaflet(s). Moderate mitral annular calcification. Mild mitral valve regurgitation. Tricuspid Valve: The tricuspid valve is normal in structure. Tricuspid valve regurgitation is mild. Aortic Valve: The aortic valve is tricuspid. There is severe calcifcation of the aortic valve. There is moderate thickening of the aortic valve. There is moderate aortic valve annular calcification. Aortic valve regurgitation is not visualized. Mild aortic stenosis is present. Aortic valve mean gradient measures 9.0 mmHg. Aortic valve peak gradient measures 16.2 mmHg. Aortic valve area, by VTI measures 2.49 cm. Pulmonic Valve: The pulmonic valve was normal in structure. Pulmonic valve regurgitation is not visualized. Aorta: The aortic root is normal in size and structure. There is mild (Grade II) atheroma plaque involving the aortic root and ascending aorta. Venous: The inferior vena cava is dilated in size with greater than 50% respiratory variability, suggesting right atrial pressure of 8 mmHg. IAS/Shunts: The atrial septum is grossly normal.  LEFT VENTRICLE PLAX 2D LVIDd:         5.00 cm   Diastology LVIDs:         3.30 cm   LV e' medial:    8.16 cm/s LV PW:         1.20 cm   LV E/e' medial:  13.5 LV IVS:        1.20 cm   LV e' lateral:   7.07 cm/s LVOT diam:     2.20 cm   LV E/e' lateral: 15.6 LV SV:         90 LV SV Index:   38 LVOT Area:     3.80 cm  RIGHT VENTRICLE          IVC RV Basal diam:  4.10 cm   IVC diam: 2.90 cm RV Mid diam:    3.40 cm LEFT ATRIUM              Index        RIGHT ATRIUM  Index LA diam:        3.70 cm  1.56 cm/m   RA Area:     26.00 cm LA Vol (A2C):   111.0 ml 46.89 ml/m  RA Volume:   80.20 ml  33.88 ml/m LA Vol (A4C):   92.8 ml  39.20 ml/m LA Biplane Vol: 103.0 ml 43.51 ml/m  AORTIC VALVE AV Area (Vmax):    2.31 cm AV Area (Vmean):   2.36 cm AV Area (VTI):     2.49 cm AV Vmax:           201.00 cm/s AV Vmean:          134.000 cm/s AV VTI:            0.361 m AV Peak Grad:      16.2 mmHg AV Mean Grad:      9.0 mmHg LVOT Vmax:         122.00 cm/s LVOT Vmean:        83.300 cm/s LVOT VTI:          0.236 m LVOT/AV VTI ratio: 0.65  AORTA Ao Root diam: 3.60 cm MITRAL VALVE MV Area (PHT): 3.42 cm     SHUNTS MV Decel Time: 222 msec     Systemic VTI:  0.24 m MV E velocity: 110.00 cm/s  Systemic Diam: 2.20 cm MV A velocity: 118.00 cm/s MV E/A ratio:  0.93 Dixie Dials MD Electronically signed by Dixie Dials MD Signature Date/Time: 05/06/2021/3:38:57 PM    Final     Assessment / Plan: 1.  Septic shock: Acute pyelonephritis, off pressors, and on broad-spectrum antibiotics. 2.  Anemia: Hemoglobin 6.7 (8.6 on 04/04/21, 11.2 on 03/19/21) --> 5.9 -->2 unit PRBCs ordered, CT as above with the duodenitis of the first and second portion of the duodenum, underlying ulcer not excluded, patient describes stool as "darker than usual" and admits to some nausea and vomiting, BUN remains normal; consider upper GI bleed most likely.  Hgb 7.4 grams today.  Light brown stool on my rectal exam 12/10. 3.  Urosepsis:  On abx. 4.  History of CVA: Intracerebral aneurysm of the right middle cerebral artery. 5.  Mild AKI:  Resolved.   -Pantoprazole 40 mg PO BID.  -Monitor Hgb and transfuse further prn. -EGD 12/12 with Dr. Havery Moros.   LOS: 3 days   Laban Emperor. Zehr  05/07/2021, 11:14 AM   I have taken an interval history, reviewed the chart and examined the patient. I agree with the Advanced  Practitioner's note, impression and recommendations.  Majority the medical decision-making in the formulation of the assessment and plan were performed by me.  My additional thoughts are as follows:  Still no overt GI bleeding.  Uncertain if true finding on duodenum based on noncontrast CT imaging.  Chronic normocytic anemia, might be unrelated.  EGD tomorrow.  If negative, GI will sign off and patient needs outpatient hematology evaluation.   Nelida Meuse III Office:[phone]

## 2021-05-07 NOTE — Progress Notes (Addendum)
PROGRESS NOTE  William Leonard UJW:[id number] DOB: 15-Feb-1946   PCP: Manon Hilding, MD  Patient is from: Home.  Lives with his wife.  Has walker that he uses occasionally.  DOA: 05/04/2021 LOS: 3  Chief complaints:  Chief Complaint  Patient presents with   Weakness     Brief Narrative / Interim history: 75 year old male with PMH of left ureteral stone s/p stent in 01/2021, recurrent urosepsis, right MCA aneurysm, alcohol abuse, HTN and HLD who presented to [facility] with progressive generalized weakness, fever, nausea for 2 days, and admitted to [facility] ICU for septic shock due to possible acute pyelonephritis, and anemia.  Febrile to 102.6.  Had no leukocytosis but lactic acid to 3.1.  Also anemic to 6.7.  UA without significant finding.  CT abdomen and pelvis concerning for duodenitis and mild hydroureter with periureteral fat stranding around ureteral stent.  Cultures obtained.  He was started on Levophed and broad-spectrum antibiotics.   Patient came off vasopressor.  Blood cultures NGTD.  Antibiotic de-escalated to IV cefepime.  Transferred to [facility] hospitalist service on 05/07/2021.  GI following for possible blood loss anemia and upper GI bleed.  Subjective: Seen and examined earlier this morning.  No major events overnight of this morning.  No complaints.  He denies chest pain, dyspnea, GI or UTI symptoms.  He says his last stool was slightly dark yesterday.  Objective: Vitals:   05/06/21 1830 05/06/21 2355 05/07/21 0412 05/07/21 0844  BP: 124/66 95/66 120/65 116/63  Pulse: 88 85 88 95  Resp: 19 19 17  (!) 30  Temp: 98.1 F (36.7 C) 97.8 F (36.6 C) 98.3 F (36.8 C) 98.1 F (36.7 C)  TempSrc: Oral Oral Oral Oral  SpO2: 97% 93% 90% 93%  Weight:   110.5 kg   Height:        Intake/Output Summary (Last 24 hours) at 05/07/2021 1049 Last data filed at 05/07/2021 0844 Gross per 24 hour  Intake 1132.52 ml  Output 800 ml  Net 332.52 ml   Filed Weights   05/04/21 1915 05/06/21 0500  05/07/21 0412  Weight: 107 kg 110.9 kg 110.5 kg    Examination:  GENERAL: No apparent distress.  Nontoxic. HEENT: MMM.  Vision and hearing grossly intact.  NECK: Supple.  No apparent JVD.  RESP:  No IWOB.  Fair aeration bilaterally. CVS:  RRR. Heart sounds normal.  ABD/GI/GU: BS+. Abd soft, NTND.  No CVA tenderness. MSK/EXT:  Moves extremities. No apparent deformity. No edema.  SKIN: no apparent skin lesion or wound NEURO: Awake, alert and oriented appropriately.  No apparent focal neuro deficit. PSYCH: Calm. Normal affect.   Procedures:  None  Microbiology summarized: [id number] and influenza PCR nonreactive. Blood cultures NGTD.  Assessment & Plan: Septic shock due to possible pyelonephritis-patient with recurrent UTI/sepsis.  Recently treated for E. coli UTI and E. coli bacteremia with nephrolithiasis.  Presenting with generalized weakness, fever and nausea.  CT abdomen and pelvis concerning for left periureteral fat stranding around ureteral stent.  UA with moderate Hgb and rare bacteria.  Blood cultures NGTD.  Urine culture pending.  Sepsis physiology resolved. -Continue IV cefepime pending urine cultures  History of ureteral stone s/p ureteral stent in 01/2021: Definitive stone treatment and stent removal delayed until he is treated for right MCA aneurysm. -Antibiotics as above -Outpatient follow-up with urology  Acute on chronic blood loss anemia: Baseline Hgb 10-11.  Concern about upper GI bleed given melena.  Transfuse 2 units so far. Recent Labs  04/04/21 3846 05/04/21 1937 05/05/21 0744 05/05/21 1715 05/06/21 0735 05/07/21 0110  HGB 8.6* 6.7* 5.9* 7.7* 7.8* 7.4*  -Monitor H&H -Continue PPI -GI following-plan for EGD this hospitalization -Check anemia panel  Possible duodenitis: -Plan for EGD this hospitalization -Continue PPI  AKI/azotemia: Resolved. Recent Labs    04/04/21 0937 05/04/21 1937 05/05/21 0744 05/06/21 0735 05/07/21 0110  BUN 34* 28*  22 20 18   CREATININE 1.24 1.38* 1.21 1.04 0.99  -Monitor intermittently  Right MCA aneurysm: was scheduled  for craniotomy and clipping on 05/11/2021 -We will notify neurosurgery if remains in-house  BPH/phimosis -Continue home Flomax. -Outpatient follow-up with urology.  Hypokalemia: K3.3. -P.o. KCl 40x2  Hyponatremia: Resolved.  Lactic acidosis: Resolved.  Elevated liver enzymes: mild -Recheck in the morning  Body mass index is 31.28 kg/m.         DVT prophylaxis:  SCDs Start: 05/05/21 0321  Code Status: Full code Family Communication: Patient and/or RN. Available if any question.  Level of care: Med-Surg Status is: Inpatient  Remains inpatient appropriate because: Patient is on IV antibiotics for severe sepsis due to possible pyelonephritis.  Also needs EGD to evaluate for GI bleed and duodenitis.   Final disposition: Likely home when medically cleared    Consultants:  Pulmonology Gastroenterology   Sch Meds:  Scheduled Meds:  Chlorhexidine Gluconate Cloth  6 each Topical Q0600   pantoprazole  40 mg Oral BID   Continuous Infusions:  sodium chloride 250 mL (05/06/21 0142)   ceFEPime (MAXIPIME) IV 2 g (05/07/21 0604)   norepinephrine (LEVOPHED) Adult infusion Stopped (05/06/21 1316)   PRN Meds:.docusate sodium, polyethylene glycol  Antimicrobials: Anti-infectives (From admission, onward)    Start     Dose/Rate Route Frequency Ordered Stop   05/06/21 1400  ceFEPIme (MAXIPIME) 2 g in sodium chloride 0.9 % 100 mL IVPB        2 g 200 mL/hr over 30 Minutes Intravenous Every 8 hours 05/06/21 0951     05/06/21 1100  ceFEPIme (MAXIPIME) 2 g in sodium chloride 0.9 % 100 mL IVPB        2 g 200 mL/hr over 30 Minutes Intravenous  Once 05/06/21 1012 05/06/21 1045   05/05/21 1000  ceFEPIme (MAXIPIME) 2 g in sodium chloride 0.9 % 100 mL IVPB  Status:  Discontinued        2 g 200 mL/hr over 30 Minutes Intravenous Every 12 hours 05/04/21 2230 05/06/21 0951    05/04/21 2245  vancomycin (VANCOREADY) IVPB 1500 mg/300 mL  Status:  Discontinued        1,500 mg 150 mL/hr over 120 Minutes Intravenous Every 24 hours 05/04/21 2230 05/05/21 0859   05/04/21 2215  ceFEPIme (MAXIPIME) 2 g in sodium chloride 0.9 % 100 mL IVPB        2 g 200 mL/hr over 30 Minutes Intravenous  Once 05/04/21 2211 05/04/21 2321   05/04/21 2215  metroNIDAZOLE (FLAGYL) IVPB 500 mg        500 mg 100 mL/hr over 60 Minutes Intravenous  Once 05/04/21 2211 05/05/21 0007   05/04/21 2215  vancomycin (VANCOCIN) IVPB 1000 mg/200 mL premix  Status:  Discontinued        1,000 mg 200 mL/hr over 60 Minutes Intravenous  Once 05/04/21 2211 05/04/21 2229        I have personally reviewed the following labs and images: CBC: Recent Labs  Lab 05/04/21 1937 05/05/21 0744 05/05/21 1715 05/06/21 0735 05/07/21 0110  WBC 5.9 6.8  --  8.8 5.8  NEUTROABS 5.3  --   --   --   --   HGB 6.7* 5.9* 7.7* 7.8* 7.4*  HCT 21.3* 19.0* 23.4* 24.5* 23.9*  MCV 86.9 85.2  --  85.4 86.9  PLT 207 206  --  247 246   BMP &GFR Recent Labs  Lab 05/04/21 1937 05/05/21 0744 05/06/21 0735 05/07/21 0110  NA 134* 136 139 139  K 3.3* 3.6 3.6 3.3*  CL 101 104 108 106  CO2 20* 22 24 26   GLUCOSE 159* 101* 133* 104*  BUN 28* 22 20 18   CREATININE 1.38* 1.21 1.04 0.99  CALCIUM 8.3* 8.2* 8.5* 8.4*  MG  --  2.0  --   --   PHOS  --  3.9  --   --    Estimated Creatinine Clearance: 82.6 mL/min (by C-G formula based on SCr of 0.99 mg/dL). Liver & Pancreas: Recent Labs  Lab 05/04/21 1937  AST 57*  ALT 64*  ALKPHOS 277*  BILITOT 0.2*  PROT 6.7  ALBUMIN 3.1*   No results for input(s): LIPASE, AMYLASE in the last 168 hours. No results for input(s): AMMONIA in the last 168 hours. Diabetic: No results for input(s): HGBA1C in the last 72 hours. Recent Labs  Lab 05/05/21 0210 05/05/21 0323  GLUCAP 126* 109*   Cardiac Enzymes: No results for input(s): CKTOTAL, CKMB, CKMBINDEX, TROPONINI in the last 168  hours. No results for input(s): PROBNP in the last 8760 hours. Coagulation Profile: Recent Labs  Lab 05/04/21 1937  INR 1.2   Thyroid Function Tests: No results for input(s): TSH, T4TOTAL, FREET4, T3FREE, THYROIDAB in the last 72 hours. Lipid Profile: No results for input(s): CHOL, HDL, LDLCALC, TRIG, CHOLHDL, LDLDIRECT in the last 72 hours. Anemia Panel: No results for input(s): VITAMINB12, FOLATE, FERRITIN, TIBC, IRON, RETICCTPCT in the last 72 hours. Urine analysis:    Component Value Date/Time   COLORURINE YELLOW 05/05/2021 0913   APPEARANCEUR CLEAR 05/05/2021 0913   APPEARANCEUR Clear 04/06/2021 1340   LABSPEC 1.010 05/05/2021 0913   PHURINE 6.0 05/05/2021 0913   GLUCOSEU NEGATIVE 05/05/2021 0913   HGBUR MODERATE (A) 05/05/2021 0913   BILIRUBINUR NEGATIVE 05/05/2021 0913   BILIRUBINUR Negative 04/06/2021 1340   KETONESUR NEGATIVE 05/05/2021 0913   PROTEINUR 30 (A) 05/05/2021 0913   NITRITE NEGATIVE 05/05/2021 0913   LEUKOCYTESUR NEGATIVE 05/05/2021 0913   Sepsis Labs: Invalid input(s): PROCALCITONIN, [place]  Microbiology: Recent Results (from the past 240 hour(s))  Blood Culture (routine x 2)     Status: None (Preliminary result)   Collection Time: 05/04/21  7:37 PM   Specimen: Right Antecubital; Blood  Result Value Ref Range Status   Specimen Description RIGHT ANTECUBITAL  Final   Special Requests   Final    BOTTLES DRAWN AEROBIC AND ANAEROBIC Blood Culture results may not be optimal due to an excessive volume of blood received in culture bottles   Culture   Final    NO GROWTH 3 DAYS Performed at [facility], [address], [place], [place] [address]    Report Status PENDING  Incomplete  Blood Culture (routine x 2)     Status: None (Preliminary result)   Collection Time: 05/04/21  7:37 PM   Specimen: BLOOD RIGHT FOREARM  Result Value Ref Range Status   Specimen Description BLOOD RIGHT FOREARM  Final   Special Requests   Final    BOTTLES DRAWN AEROBIC  AND ANAEROBIC Blood Culture adequate volume   Culture   Final  NO GROWTH 3 DAYS Performed at [facility], [address] [place], [place] [address]    Report Status PENDING  Incomplete  Resp Panel by RT-PCR (Flu A&B, Covid) Nasopharyngeal Swab     Status: None   Collection Time: 05/04/21  7:41 PM   Specimen: Nasopharyngeal Swab; Nasopharyngeal(NP) swabs in vial transport medium  Result Value Ref Range Status   SARS Coronavirus 2 by RT PCR NEGATIVE NEGATIVE Final    Comment: (NOTE) SARS-CoV-2 target nucleic acids are NOT DETECTED.  The SARS-CoV-2 RNA is generally detectable in upper respiratory specimens during the acute phase of infection. The lowest concentration of SARS-CoV-2 viral copies this assay can detect is 138 copies/mL. A negative result does not preclude SARS-Cov-2 infection and should not be used as the sole basis for treatment or other patient management decisions. A negative result may occur with  improper specimen collection/handling, submission of specimen other than nasopharyngeal swab, presence of viral mutation(s) within the areas targeted by this assay, and inadequate number of viral copies(<138 copies/mL). A negative result must be combined with clinical observations, patient history, and epidemiological information. The expected result is Negative.  Fact Sheet for Patients:  EntrepreneurPulse.com.au  Fact Sheet for Healthcare Providers:  IncredibleEmployment.be  This test is no t yet approved or cleared by the [place] FDA and  has been authorized for detection and/or diagnosis of SARS-CoV-2 by FDA under an Emergency Use Authorization (EUA). This EUA will remain  in effect (meaning this test can be used) for the duration of the COVID-19 declaration under Section 564(b)(1) of the Act, 21 U.S.C.section 360bbb-3(b)(1), unless the authorization is terminated  or revoked sooner.       Influenza A by PCR NEGATIVE  NEGATIVE Final   Influenza B by PCR NEGATIVE NEGATIVE Final    Comment: (NOTE) The Xpert Xpress SARS-CoV-2/FLU/RSV plus assay is intended as an aid in the diagnosis of influenza from Nasopharyngeal swab specimens and should not be used as a sole basis for treatment. Nasal washings and aspirates are unacceptable for Xpert Xpress SARS-CoV-2/FLU/RSV testing.  Fact Sheet for Patients: EntrepreneurPulse.com.au  Fact Sheet for Healthcare Providers: IncredibleEmployment.be  This test is not yet approved or cleared by the [place] FDA and has been authorized for detection and/or diagnosis of SARS-CoV-2 by FDA under an Emergency Use Authorization (EUA). This EUA will remain in effect (meaning this test can be used) for the duration of the COVID-19 declaration under Section 564(b)(1) of the Act, 21 U.S.C. section 360bbb-3(b)(1), unless the authorization is terminated or revoked.  Performed at [facility], [address], [place], [place] [address]   MRSA Next Gen by PCR, Nasal     Status: None   Collection Time: 05/05/21  1:57 AM   Specimen: Nasal Mucosa; Nasal Swab  Result Value Ref Range Status   MRSA by PCR Next Gen NOT DETECTED NOT DETECTED Final    Comment: (NOTE) The GeneXpert MRSA Assay (FDA approved for NASAL specimens only), is one component of a comprehensive MRSA colonization surveillance program. It is not intended to diagnose MRSA infection nor to guide or monitor treatment for MRSA infections. Test performance is not FDA approved in patients less than 42 years old. Performed at [facility], [place] [address], [place], [place] [address]     Radiology Studies: ECHOCARDIOGRAM COMPLETE  Result Date: 05/06/2021    ECHOCARDIOGRAM REPORT   Patient Name:   William Leonard Date of Exam: 05/06/2021 Medical Rec #:  [id number]  Height:       74.0 in Accession #:    [id number]       Weight:       244.5 lb Date of Birth:  04/26/1946          BSA:          2.367 m Patient Age:    1 years         BP:           106/63 mmHg Patient Gender: M                HR:           86 bpm. Exam Location:  Inpatient Procedure: 2D Echo, Cardiac Doppler, Color Doppler and Intracardiac            Opacification Agent Indications:     Cardiomegaly I51.7  History:         Patient has no prior history of Echocardiogram examinations.                  Signs/Symptoms:Shortness of Breath and Hypotension.  Sonographer:     Merrie Roof RDCS Referring Phys:  [id number] Candee Furbish Diagnosing Phys: Dixie Dials MD IMPRESSIONS  1. Left ventricular ejection fraction, by estimation, is 55 to 60%. The left ventricle has normal function. The left ventricle has no regional wall motion abnormalities. There is mild concentric left ventricular hypertrophy. Left ventricular diastolic parameters are consistent with Grade I diastolic dysfunction (impaired relaxation).  2. Right ventricular systolic function is normal. The right ventricular size is normal.  3. Left atrial size was mildly dilated.  4. Right atrial size was mildly dilated.  5. The mitral valve is degenerative. Mild mitral valve regurgitation. Moderate mitral annular calcification.  6. The aortic valve is tricuspid. There is severe calcifcation of the aortic valve. There is moderate thickening of the aortic valve. Aortic valve regurgitation is not visualized. Mild aortic valve stenosis.  7. There is mild (Grade II) atheroma plaque involving the aortic root and ascending aorta.  8. The inferior vena cava is dilated in size with >50% respiratory variability, suggesting right atrial pressure of 8 mmHg. FINDINGS  Left Ventricle: Left ventricular ejection fraction, by estimation, is 55 to 60%. The left ventricle has normal function. The left ventricle has no regional wall motion abnormalities. The left ventricular internal cavity size was normal in size. There is  mild concentric left ventricular hypertrophy. Left ventricular  diastolic parameters are consistent with Grade I diastolic dysfunction (impaired relaxation). Right Ventricle: The right ventricular size is normal. No increase in right ventricular wall thickness. Right ventricular systolic function is normal. Left Atrium: Left atrial size was mildly dilated. Right Atrium: Right atrial size was mildly dilated. Pericardium: There is no evidence of pericardial effusion. Mitral Valve: The mitral valve is degenerative in appearance. There is mild thickening of the mitral valve leaflet(s). There is moderate calcification of the mitral valve leaflet(s). Moderate mitral annular calcification. Mild mitral valve regurgitation. Tricuspid Valve: The tricuspid valve is normal in structure. Tricuspid valve regurgitation is mild. Aortic Valve: The aortic valve is tricuspid. There is severe calcifcation of the aortic valve. There is moderate thickening of the aortic valve. There is moderate aortic valve annular calcification. Aortic valve regurgitation is not visualized. Mild aortic stenosis is present. Aortic valve mean gradient measures 9.0 mmHg. Aortic valve peak gradient measures 16.2 mmHg. Aortic valve area, by VTI measures 2.49 cm. Pulmonic Valve: The pulmonic valve was normal in structure. Pulmonic  valve regurgitation is not visualized. Aorta: The aortic root is normal in size and structure. There is mild (Grade II) atheroma plaque involving the aortic root and ascending aorta. Venous: The inferior vena cava is dilated in size with greater than 50% respiratory variability, suggesting right atrial pressure of 8 mmHg. IAS/Shunts: The atrial septum is grossly normal.  LEFT VENTRICLE PLAX 2D LVIDd:         5.00 cm   Diastology LVIDs:         3.30 cm   LV e' medial:    8.16 cm/s LV PW:         1.20 cm   LV E/e' medial:  13.5 LV IVS:        1.20 cm   LV e' lateral:   7.07 cm/s LVOT diam:     2.20 cm   LV E/e' lateral: 15.6 LV SV:         90 LV SV Index:   38 LVOT Area:     3.80 cm  RIGHT  VENTRICLE          IVC RV Basal diam:  4.10 cm  IVC diam: 2.90 cm RV Mid diam:    3.40 cm LEFT ATRIUM              Index        RIGHT ATRIUM           Index LA diam:        3.70 cm  1.56 cm/m   RA Area:     26.00 cm LA Vol (A2C):   111.0 ml 46.89 ml/m  RA Volume:   80.20 ml  33.88 ml/m LA Vol (A4C):   92.8 ml  39.20 ml/m LA Biplane Vol: 103.0 ml 43.51 ml/m  AORTIC VALVE AV Area (Vmax):    2.31 cm AV Area (Vmean):   2.36 cm AV Area (VTI):     2.49 cm AV Vmax:           201.00 cm/s AV Vmean:          134.000 cm/s AV VTI:            0.361 m AV Peak Grad:      16.2 mmHg AV Mean Grad:      9.0 mmHg LVOT Vmax:         122.00 cm/s LVOT Vmean:        83.300 cm/s LVOT VTI:          0.236 m LVOT/AV VTI ratio: 0.65  AORTA Ao Root diam: 3.60 cm MITRAL VALVE MV Area (PHT): 3.42 cm     SHUNTS MV Decel Time: 222 msec     Systemic VTI:  0.24 m MV E velocity: 110.00 cm/s  Systemic Diam: 2.20 cm MV A velocity: 118.00 cm/s MV E/A ratio:  0.93 Dixie Dials MD Electronically signed by Dixie Dials MD Signature Date/Time: 05/06/2021/3:38:57 PM    Final        Brydan Downard T. Winfield  If 7PM-7AM, please contact night-coverage [email or web address] 05/07/2021, 10:49 AM

## 2021-05-08 ENCOUNTER — Inpatient Hospital Stay (HOSPITAL_COMMUNITY): Payer: Medicare HMO | Admitting: Anesthesiology

## 2021-05-08 ENCOUNTER — Encounter (HOSPITAL_COMMUNITY): Payer: Self-pay | Admitting: Critical Care Medicine

## 2021-05-08 ENCOUNTER — Encounter (HOSPITAL_COMMUNITY)
Admission: EM | Disposition: A | Discharge status: Home / Self Care | Payer: Self-pay | Source: Home / Self Care | Attending: Student

## 2021-05-08 DIAGNOSIS — R933 Abnormal findings on diagnostic imaging of other parts of digestive tract: Secondary | ICD-10-CM

## 2021-05-08 DIAGNOSIS — D649 Anemia, unspecified: Secondary | ICD-10-CM

## 2021-05-08 DIAGNOSIS — K269 Duodenal ulcer, unspecified as acute or chronic, without hemorrhage or perforation: Secondary | ICD-10-CM

## 2021-05-08 HISTORY — PX: ESOPHAGOGASTRODUODENOSCOPY (EGD) WITH PROPOFOL: SHX5813

## 2021-05-08 HISTORY — PX: BIOPSY: SHX5522

## 2021-05-08 LAB — TYPE AND SCREEN
ABO/RH(D): O POS
Antibody Screen: NEGATIVE
Unit division: 0

## 2021-05-08 LAB — CBC
HCT: 23.5 % — ABNORMAL LOW (ref 39.0–52.0)
Hemoglobin: 7.3 g/dL — ABNORMAL LOW (ref 13.0–17.0)
MCH: 26.8 pg (ref 26.0–34.0)
MCHC: 31.1 g/dL (ref 30.0–36.0)
MCV: 86.4 fL (ref 80.0–100.0)
Platelets: 262 10*3/uL (ref 150–400)
RBC: 2.72 MIL/uL — ABNORMAL LOW (ref 4.22–5.81)
RDW: 16.3 % — ABNORMAL HIGH (ref 11.5–15.5)
WBC: 5.1 10*3/uL (ref 4.0–10.5)
nRBC: 0 % (ref 0.0–0.2)

## 2021-05-08 LAB — PREPARE RBC (CROSSMATCH)

## 2021-05-08 LAB — COMPREHENSIVE METABOLIC PANEL
ALT: 32 U/L (ref 0–44)
AST: 24 U/L (ref 15–41)
Albumin: 2.3 g/dL — ABNORMAL LOW (ref 3.5–5.0)
Alkaline Phosphatase: 151 U/L — ABNORMAL HIGH (ref 38–126)
Anion gap: 9 (ref 5–15)
BUN: 22 mg/dL (ref 8–23)
CO2: 27 mmol/L (ref 22–32)
Calcium: 8.6 mg/dL — ABNORMAL LOW (ref 8.9–10.3)
Chloride: 105 mmol/L (ref 98–111)
Creatinine, Ser: 1.05 mg/dL (ref 0.61–1.24)
GFR, Estimated: >60 mL/min (ref >60)
Glucose, Bld: 105 mg/dL — ABNORMAL HIGH (ref 70–99)
Potassium: 3.2 mmol/L — ABNORMAL LOW (ref 3.5–5.1)
Sodium: 141 mmol/L (ref 135–145)
Total Bilirubin: 0.2 mg/dL — ABNORMAL LOW (ref 0.3–1.2)
Total Protein: 5.7 g/dL — ABNORMAL LOW (ref 6.5–8.1)

## 2021-05-08 LAB — RETICULOCYTES
Immature Retic Fract: 27.4 % — ABNORMAL HIGH (ref 2.3–15.9)
RBC.: 2.7 MIL/uL — ABNORMAL LOW (ref 4.22–5.81)
Retic Count, Absolute: 25.1 10*3/uL (ref 19.0–186.0)
Retic Ct Pct: 0.9 % (ref 0.4–3.1)

## 2021-05-08 LAB — BPAM RBC
Blood Product Expiration Date: 202301032359
Unit Type and Rh: 5100

## 2021-05-08 LAB — MAGNESIUM: Magnesium: 2.1 mg/dL (ref 1.7–2.4)

## 2021-05-08 LAB — IRON AND TIBC
Iron: 20 ug/dL — ABNORMAL LOW (ref 45–182)
Saturation Ratios: 9 % — ABNORMAL LOW (ref 17.9–39.5)
TIBC: 227 ug/dL — ABNORMAL LOW (ref 250–450)
UIBC: 207 ug/dL

## 2021-05-08 LAB — PHOSPHORUS: Phosphorus: 3.4 mg/dL (ref 2.5–4.6)

## 2021-05-08 LAB — HEMOGLOBIN A1C
Hgb A1c MFr Bld: 5.1 % (ref 4.8–5.6)
Mean Plasma Glucose: 99.67 mg/dL

## 2021-05-08 LAB — CK: Total CK: 25 U/L — ABNORMAL LOW (ref 49–397)

## 2021-05-08 LAB — FOLATE: Folate: 11.2 ng/mL (ref >5.9)

## 2021-05-08 LAB — FERRITIN: Ferritin: 62 ng/mL (ref 24–336)

## 2021-05-08 LAB — VITAMIN B12: Vitamin B-12: 342 pg/mL (ref 180–914)

## 2021-05-08 SURGERY — ESOPHAGOGASTRODUODENOSCOPY (EGD) WITH PROPOFOL
Anesthesia: Monitor Anesthesia Care

## 2021-05-08 MED ORDER — LACTATED RINGERS IV SOLN: INTRAVENOUS | Status: DC | PRN

## 2021-05-08 MED ORDER — PROPOFOL 500 MG/50ML IV EMUL
INTRAVENOUS | Status: DC | PRN
  Administered 2021-05-08: 100 ug/kg/min via INTRAVENOUS

## 2021-05-08 MED ORDER — CEFADROXIL 500 MG PO CAPS
500.0000 mg | ORAL_CAPSULE | Freq: Two times a day (BID) | ORAL | Status: DC
  Administered 2021-05-08 – 2021-05-09 (×2): 500 mg via ORAL
  Filled 2021-05-08 (×2): qty 1

## 2021-05-08 MED ORDER — POTASSIUM CHLORIDE 10 MEQ/100ML IV SOLN
10.0000 meq | INTRAVENOUS | Status: AC
  Administered 2021-05-08 (×4): 10 meq via INTRAVENOUS
  Filled 2021-05-08 (×4): qty 100

## 2021-05-08 MED ORDER — POTASSIUM CHLORIDE CRYS ER 20 MEQ PO TBCR
40.0000 meq | EXTENDED_RELEASE_TABLET | Freq: Once | ORAL | Status: AC
  Administered 2021-05-08: 40 meq via ORAL
  Filled 2021-05-08: qty 2

## 2021-05-08 MED ORDER — LIDOCAINE 2% (20 MG/ML) 5 ML SYRINGE
INTRAMUSCULAR | Status: DC | PRN
  Administered 2021-05-08: 40 mg via INTRAVENOUS

## 2021-05-08 MED ORDER — SODIUM CHLORIDE 0.9% IV SOLUTION: Freq: Once | INTRAVENOUS | Status: AC

## 2021-05-08 MED ORDER — SODIUM CHLORIDE 0.9 % IV SOLN: INTRAVENOUS | Status: DC

## 2021-05-08 MED ORDER — PROPOFOL 10 MG/ML IV BOLUS
INTRAVENOUS | Status: DC | PRN
  Administered 2021-05-08: 20 mg via INTRAVENOUS

## 2021-05-08 SURGICAL SUPPLY — 15 items

## 2021-05-08 NOTE — Transfer of Care (Signed)
Immediate Anesthesia Transfer of Care Note  Patient: William Leonard  Procedure(s) Performed: ESOPHAGOGASTRODUODENOSCOPY (EGD) WITH PROPOFOL BIOPSY  Patient Location: PACU  Anesthesia Type:MAC  Level of Consciousness: drowsy and patient cooperative  Airway & Oxygen Therapy: Patient Spontanous Breathing and Patient connected to face mask oxygen  Post-op Assessment: Report given to RN and Post -op Vital signs reviewed and stable  Post vital signs: Reviewed and stable  Last Vitals:  Vitals Value Taken Time  BP    Temp    Pulse    Resp    SpO2      Last Pain:  Vitals:   05/08/21 1312  TempSrc: Temporal  PainSc:          Complications: No notable events documented.

## 2021-05-08 NOTE — Plan of Care (Signed)
  Problem: Clinical Measurements: Goal: Ability to maintain clinical measurements within normal limits will improve Outcome: Progressing   

## 2021-05-08 NOTE — Progress Notes (Signed)
Pharmacy Antibiotic Note  William Leonard is a 75 y.o. male admitted on 05/04/2021 with  urosepsis .  Pharmacy originally consulted for vanc and cefepime dosing, but this has been narrowed to just cefepime. WBC wnl and Scr stable. Patient is afebrile. Patient is currently being worked up for possible GIB.   WBC wnl, Afebtril, Renal function stable. Cultures NGTD. Continue for 7 days per consult.  Plan: Continue Cefepime 2g IV q12 until 12/14    Height: 6\' 2"  (188 cm) Weight: 101.1 kg (222 lb 14.2 oz) IBW/kg (Calculated) : 82.2  Temp (24hrs), Avg:98 F (36.7 C), Min:97.5 F (36.4 C), Max:98.3 F (36.8 C)  Recent Labs  Lab 05/04/21 1937 05/04/21 2134 05/05/21 0744 05/06/21 0735 05/07/21 0110 05/08/21 0129  WBC 5.9  --  6.8 8.8 5.8 5.1  CREATININE 1.38*  --  1.21 1.04 0.99 1.05  LATICACIDVEN 5.2* 3.1*  --   --   --   --      Estimated Creatinine Clearance: 74.8 mL/min (by C-G formula based on SCr of 1.05 mg/dL).    Allergies  Allergen Reactions   Other Nausea And Vomiting    Blue cheese    Shellfish Allergy Nausea And Vomiting    mussels    Antimicrobials this admission: 12/8 cefepime>> 12/14  12/8 vanc>>12/9    Microbiology results: 12/8 blood>> ngtd  12/9 MRSA PCR >> neg 12/10 Ucx (on abx)  ngtd   Benetta Spar, PharmD, BCPS, [facility] Clinical Pharmacist  Please check AMION for all [place] phone numbers After 10:00 PM, call [place] [phone]

## 2021-05-08 NOTE — Anesthesia Preprocedure Evaluation (Addendum)
Anesthesia Evaluation  Patient identified by MRN, date of birth, ID band Patient awake    Reviewed: Allergy & Precautions, NPO status , Patient's Chart, lab work & pertinent test results  Airway Mallampati: III  TM Distance: >3 FB Neck ROM: Full    Dental  (+) Dental Advisory Given, Poor Dentition, Missing   Pulmonary neg pulmonary ROS, former smoker,    Pulmonary exam normal breath sounds clear to auscultation       Cardiovascular negative cardio ROS Normal cardiovascular exam Rhythm:Regular Rate:Normal     Neuro/Psych CVA (01/2021, s/p stenting 03/2021), No Residual Symptoms negative psych ROS   GI/Hepatic (+)       alcohol use, Anemia, intermittent black stools   Endo/Other  diabetes, Well Controlled, Type 2a1c 5.1  Renal/GU Admitted in acute septic shock 2/2 pyelonephritis  negative genitourinary   Musculoskeletal negative musculoskeletal ROS (+)   Abdominal   Peds  Hematology  (+) Blood dyscrasia, anemia , 7.3/23.5 Hb was as low as 5.9, now s/p 2 units prbcs Getting another unit now    Anesthesia Other Findings   Reproductive/Obstetrics negative OB ROS                           Anesthesia Physical Anesthesia Plan  ASA: 3  Anesthesia Plan: MAC   Post-op Pain Management:    Induction:   PONV Risk Score and Plan: Propofol infusion, TIVA and Treatment may vary due to age or medical condition  Airway Management Planned: Natural Airway and Simple Face Mask  Additional Equipment: None  Intra-op Plan:   Post-operative Plan:   Informed Consent: I have reviewed the patients History and Physical, chart, labs and discussed the procedure including the risks, benefits and alternatives for the proposed anesthesia with the patient or authorized representative who has indicated his/her understanding and acceptance.     Dental advisory given  Plan Discussed with: CRNA  Anesthesia  Plan Comments:        Anesthesia Quick Evaluation

## 2021-05-08 NOTE — Care Management Important Message (Signed)
Important Message  Patient Details  Name: William Leonard MRN: [id number] Date of Birth: 03/26/1946   Medicare Important Message Given:  Yes     Hannah Beat 05/08/2021, 11:53 AM

## 2021-05-08 NOTE — Anesthesia Postprocedure Evaluation (Signed)
Anesthesia Post Note  Patient: William Leonard  Procedure(s) Performed: ESOPHAGOGASTRODUODENOSCOPY (EGD) WITH PROPOFOL BIOPSY     Patient location during evaluation: PACU Anesthesia Type: MAC Level of consciousness: awake and alert Pain management: pain level controlled Vital Signs Assessment: post-procedure vital signs reviewed and stable Respiratory status: spontaneous breathing, nonlabored ventilation and respiratory function stable Cardiovascular status: blood pressure returned to baseline and stable Postop Assessment: no apparent nausea or vomiting Anesthetic complications: no   No notable events documented.  Last Vitals:  Vitals:   05/08/21 1415 05/08/21 1430  BP: 111/61 (!) 105/58  Pulse: 79 82  Resp: 17 (!) 22  Temp: 36.5 C 36.5 C  SpO2: 100% 93%    Last Pain:  Vitals:   05/08/21 1430  TempSrc:   PainSc: 0-No pain                 Pervis Hocking

## 2021-05-08 NOTE — Progress Notes (Signed)
IVT consulted for difficult PIV placement. Upon arrival, transitional care in room and pt in chair  Primary RN advised to put in new consult when pt is in the bed and ready for PIV assessment.

## 2021-05-08 NOTE — Progress Notes (Signed)
PROGRESS NOTE  William Leonard KXF:[id number] DOB: [birth year]   PCP: Manon Hilding, MD  Patient is from: Home.  Lives with his wife.  Has walker that he uses occasionally.  DOA: 05/04/2021 LOS: 4  Chief complaints:  Chief Complaint  Patient presents with   Weakness     Brief Narrative / Interim history: 75 year old male with PMH of left ureteral stone s/p stent in 01/2021, recurrent urosepsis, right MCA aneurysm, alcohol abuse, HTN and HLD who presented to [facility] with progressive generalized weakness, fever, nausea for 2 days, and admitted to [facility] ICU for septic shock due to possible acute pyelonephritis, and anemia.  Febrile to 102.6.  Had no leukocytosis but lactic acid to 3.1.  Also anemic to 6.7.  UA without significant finding.  CT abdomen and pelvis concerning for duodenitis and mild hydroureter with periureteral fat stranding around ureteral stent.  Cultures obtained.  He was started on Levophed and broad-spectrum antibiotics.   Patient came off vasopressor.  Vancomycin discontinued.  Antibiotic de-escalated to IV cefepime.  Transferred to [facility] hospitalist service on 05/07/2021.  Blood nd urine culture NGTD.   Antibiotics de-escalated further to cefadroxil.  EGD on 05/08/2021 showed non-bleeding duodenal ulcers with a clean ulcer base (Forrest Class III) associated with luminal narrowing as outlined and significant inflammatory change.  GI recommended p.o. Protonix 40 mg twice daily for 6 weeks followed by daily.    Subjective: Seen and examined earlier this morning before he went for surgery/EGD.  No major events overnight of this morning.  No complaints.  He denies chest pain, dyspnea, GI or UTI symptoms.  Reports making good amount of urine.  Objective: Vitals:   05/08/21 1252 05/08/21 1312 05/08/21 1415 05/08/21 1430  BP: (!) 151/85 (!) 128/96 111/61 (!) 105/58  Pulse: 80 81 79 82  Resp:  (!) 21 17 (!) 22  Temp:  98.7 F (37.1 C) 97.7 F (36.5 C) 97.7 F (36.5 C)  TempSrc:  Temporal Temporal    SpO2: 98% 100% 100% 93%  Weight:      Height:        Intake/Output Summary (Last 24 hours) at 05/08/2021 1547 Last data filed at 05/08/2021 1410 Gross per 24 hour  Intake 1178.33 ml  Output --  Net 1178.33 ml   Filed Weights   05/06/21 0500 05/07/21 0412 05/08/21 0418  Weight: 110.9 kg 110.5 kg 101.1 kg    Examination:  GENERAL: No apparent distress.  Nontoxic. HEENT: MMM.  Vision and hearing grossly intact.  NECK: Supple.  No apparent JVD.  RESP:  No IWOB.  Fair aeration bilaterally. CVS:  RRR. Heart sounds normal.  ABD/GI/GU: BS+. Abd soft, NTND.  MSK/EXT:  Moves extremities. No apparent deformity. No edema.  SKIN: no apparent skin lesion or wound NEURO: Awake and alert. Oriented appropriately.  No apparent focal neuro deficit. PSYCH: Calm. Normal affect.   Procedures:  05/08/2021-EGD showed non-bleeding duodenal ulcers with a clean ulcer base (Forrest Class III) associated with luminal narrowing as outlined and significant inflammatory change  Microbiology summarized: COVID-19 and influenza PCR nonreactive. Blood and urine cultures NGTD.  Assessment & Plan: Septic shock due to possible pyelonephritis-patient with recurrent UTI/sepsis.  Recently treated for E. coli UTI and E. coli bacteremia with nephrolithiasis.  Presenting with generalized weakness, fever and nausea.  CT abdomen and pelvis concerning for left periureteral fat stranding around ureteral stent.  UA with moderate Hgb and rare bacteria.  Blood and urine NGTD.  Urine culture pending.  Sepsis physiology  resolved. -De-escalate to cefadroxil for total of 10 days.  History of ureteral stone s/p ureteral stent in 01/2021: Definitive stone treatment and stent removal delayed until he is treated for right MCA aneurysm. -Antibiotics as above -Outpatient follow-up with urology  Acute on chronic blood loss anemia: Baseline Hgb 10-11.  Concern about upper GI bleed given melena.  Transfuse 2 units  while in ICU.  No report of further GI bleed.  EGD as above.  Anemia panel with some iron deficiency with low normal ferritin of 62 Recent Labs    04/04/21 0937 05/04/21 1937 05/05/21 0744 05/05/21 1715 05/06/21 0735 05/07/21 0110 05/08/21 0129  HGB 8.6* 6.7* 5.9* 7.7* 7.8* 7.4* 7.3*  -Transfuse 1 more unit -GI recommended p.o. Protonix 40 mg twice daily for 6 weeks followed by daily -Outpatient follow-up with GI for repeat EGD down the road  Possible duodenitis: EGD as above. -Continue PPI  AKI/azotemia: Resolved. Recent Labs    04/04/21 0937 05/04/21 1937 05/05/21 0744 05/06/21 0735 05/07/21 0110 05/08/21 0129  BUN 34* 28* 22 20 18 22   CREATININE 1.24 1.38* 1.21 1.04 0.99 1.05  -Monitor intermittently  Right MCA aneurysm: craniotomy and clipping rescheduled for 06/08/2021.  BPH/phimosis -Continue home Flomax. -Outpatient follow-up with urology.  Hypokalemia: K3.2. -P.o. KCl 40x1 -IV KCl 10x4  Hyponatremia: Resolved.  Lactic acidosis: Resolved.  Elevated liver enzymes: Resolved. -Recheck in the morning  Body mass index is 28.62 kg/m.         DVT prophylaxis:  SCDs Start: 05/05/21 0321  Code Status: Full code Family Communication: Patient and/or RN. Available if any question.  Level of care: Med-Surg Status is: Inpatient  Remains inpatient appropriate because: Blood loss anemia requiring blood transfusion and monitoring and electrolyte abnormalities   Final disposition: Likely home on 12/13 if H&H stable    Consultants:  Pulmonology Gastroenterology   Sch Meds:  Scheduled Meds:  cefadroxil  500 mg Oral BID   Chlorhexidine Gluconate Cloth  6 each Topical Q0600   pantoprazole  40 mg Oral BID   Continuous Infusions:  sodium chloride     PRN Meds:.sodium chloride, docusate sodium, polyethylene glycol  Antimicrobials: Anti-infectives (From admission, onward)    Start     Dose/Rate Route Frequency Ordered Stop   05/08/21 2200   cefadroxil (DURICEF) capsule 500 mg        500 mg Oral 2 times daily 05/08/21 1543 05/15/21 2159   05/06/21 1400  ceFEPIme (MAXIPIME) 2 g in sodium chloride 0.9 % 100 mL IVPB  Status:  Discontinued        2 g 200 mL/hr over 30 Minutes Intravenous Every 8 hours 05/06/21 0951 05/08/21 1543   05/06/21 1100  ceFEPIme (MAXIPIME) 2 g in sodium chloride 0.9 % 100 mL IVPB        2 g 200 mL/hr over 30 Minutes Intravenous  Once 05/06/21 1012 05/07/21 0659   05/05/21 1000  ceFEPIme (MAXIPIME) 2 g in sodium chloride 0.9 % 100 mL IVPB  Status:  Discontinued        2 g 200 mL/hr over 30 Minutes Intravenous Every 12 hours 05/04/21 2230 05/06/21 0951   05/04/21 2245  vancomycin (VANCOREADY) IVPB 1500 mg/300 mL  Status:  Discontinued        1,500 mg 150 mL/hr over 120 Minutes Intravenous Every 24 hours 05/04/21 2230 05/05/21 0859   05/04/21 2215  ceFEPIme (MAXIPIME) 2 g in sodium chloride 0.9 % 100 mL IVPB        2  g 200 mL/hr over 30 Minutes Intravenous  Once 05/04/21 2211 05/04/21 2321   05/04/21 2215  metroNIDAZOLE (FLAGYL) IVPB 500 mg        500 mg 100 mL/hr over 60 Minutes Intravenous  Once 05/04/21 2211 05/05/21 0007   05/04/21 2215  vancomycin (VANCOCIN) IVPB 1000 mg/200 mL premix  Status:  Discontinued        1,000 mg 200 mL/hr over 60 Minutes Intravenous  Once 05/04/21 2211 05/04/21 2229        I have personally reviewed the following labs and images: CBC: Recent Labs  Lab 05/04/21 1937 05/05/21 0744 05/05/21 1715 05/06/21 0735 05/07/21 0110 05/08/21 0129  WBC 5.9 6.8  --  8.8 5.8 5.1  NEUTROABS 5.3  --   --   --   --   --   HGB 6.7* 5.9* 7.7* 7.8* 7.4* 7.3*  HCT 21.3* 19.0* 23.4* 24.5* 23.9* 23.5*  MCV 86.9 85.2  --  85.4 86.9 86.4  PLT 207 206  --  247 246 262   BMP &GFR Recent Labs  Lab 05/04/21 1937 05/05/21 0744 05/06/21 0735 05/07/21 0110 05/08/21 0129  NA 134* 136 139 139 141  K 3.3* 3.6 3.6 3.3* 3.2*  CL 101 104 108 106 105  CO2 20* 22 24 26 27   GLUCOSE 159*  101* 133* 104* 105*  BUN 28* 22 20 18 22   CREATININE 1.38* 1.21 1.04 0.99 1.05  CALCIUM 8.3* 8.2* 8.5* 8.4* 8.6*  MG  --  2.0  --   --  2.1  PHOS  --  3.9  --   --  3.4   Estimated Creatinine Clearance: 74.8 mL/min (by C-G formula based on SCr of 1.05 mg/dL). Liver & Pancreas: Recent Labs  Lab 05/04/21 1937 05/08/21 0129  AST 57* 24  ALT 64* 32  ALKPHOS 277* 151*  BILITOT 0.2* 0.2*  PROT 6.7 5.7*  ALBUMIN 3.1* 2.3*   No results for input(s): LIPASE, AMYLASE in the last 168 hours. No results for input(s): AMMONIA in the last 168 hours. Diabetic: Recent Labs    05/08/21 0129  HGBA1C 5.1   Recent Labs  Lab 05/05/21 0210 05/05/21 0323  GLUCAP 126* 109*   Cardiac Enzymes: Recent Labs  Lab 05/08/21 0129  CKTOTAL 25*   No results for input(s): PROBNP in the last 8760 hours. Coagulation Profile: Recent Labs  Lab 05/04/21 1937  INR 1.2   Thyroid Function Tests: No results for input(s): TSH, T4TOTAL, FREET4, T3FREE, THYROIDAB in the last 72 hours. Lipid Profile: No results for input(s): CHOL, HDL, LDLCALC, TRIG, CHOLHDL, LDLDIRECT in the last 72 hours. Anemia Panel: Recent Labs    05/08/21 0129  VITAMINB12 342  FOLATE 11.2  FERRITIN 62  TIBC 227*  IRON 20*  RETICCTPCT 0.9   Urine analysis:    Component Value Date/Time   COLORURINE YELLOW 05/05/2021 0913   APPEARANCEUR CLEAR 05/05/2021 0913   APPEARANCEUR Clear 04/06/2021 1340   LABSPEC 1.010 05/05/2021 0913   PHURINE 6.0 05/05/2021 0913   GLUCOSEU NEGATIVE 05/05/2021 0913   HGBUR MODERATE (A) 05/05/2021 0913   BILIRUBINUR NEGATIVE 05/05/2021 0913   BILIRUBINUR Negative 04/06/2021 1340   KETONESUR NEGATIVE 05/05/2021 0913   PROTEINUR 30 (A) 05/05/2021 0913   NITRITE NEGATIVE 05/05/2021 0913   LEUKOCYTESUR NEGATIVE 05/05/2021 0913   Sepsis Labs: Invalid input(s): PROCALCITONIN, [place]  Microbiology: Recent Results (from the past 240 hour(s))  Blood Culture (routine x 2)     Status: None  (Preliminary result)  Collection Time: 05/04/21  7:37 PM   Specimen: Right Antecubital; Blood  Result Value Ref Range Status   Specimen Description RIGHT ANTECUBITAL  Final   Special Requests   Final    BOTTLES DRAWN AEROBIC AND ANAEROBIC Blood Culture results may not be optimal due to an excessive volume of blood received in culture bottles   Culture   Final    NO GROWTH 4 DAYS Performed at [facility], [address] [place], [place] [address]    Report Status PENDING  Incomplete  Blood Culture (routine x 2)     Status: None (Preliminary result)   Collection Time: 05/04/21  7:37 PM   Specimen: BLOOD RIGHT FOREARM  Result Value Ref Range Status   Specimen Description BLOOD RIGHT FOREARM  Final   Special Requests   Final    BOTTLES DRAWN AEROBIC AND ANAEROBIC Blood Culture adequate volume   Culture   Final    NO GROWTH 4 DAYS Performed at [facility], [address], [place], [place] [address]    Report Status PENDING  Incomplete  Resp Panel by RT-PCR (Flu A&B, Covid) Nasopharyngeal Swab     Status: None   Collection Time: 05/04/21  7:41 PM   Specimen: Nasopharyngeal Swab; Nasopharyngeal(NP) swabs in vial transport medium  Result Value Ref Range Status   SARS Coronavirus 2 by RT PCR NEGATIVE NEGATIVE Final    Comment: (NOTE) SARS-CoV-2 target nucleic acids are NOT DETECTED.  The SARS-CoV-2 RNA is generally detectable in upper respiratory specimens during the acute phase of infection. The lowest concentration of SARS-CoV-2 viral copies this assay can detect is 138 copies/mL. A negative result does not preclude SARS-Cov-2 infection and should not be used as the sole basis for treatment or other patient management decisions. A negative result may occur with  improper specimen collection/handling, submission of specimen other than nasopharyngeal swab, presence of viral mutation(s) within the areas targeted by this assay, and inadequate number of viral copies(<138 copies/mL). A  negative result must be combined with clinical observations, patient history, and epidemiological information. The expected result is Negative.  Fact Sheet for Patients:  EntrepreneurPulse.com.au  Fact Sheet for Healthcare Providers:  IncredibleEmployment.be  This test is no t yet approved or cleared by the [place] FDA and  has been authorized for detection and/or diagnosis of SARS-CoV-2 by FDA under an Emergency Use Authorization (EUA). This EUA will remain  in effect (meaning this test can be used) for the duration of the COVID-19 declaration under Section 564(b)(1) of the Act, 21 U.S.C.section 360bbb-3(b)(1), unless the authorization is terminated  or revoked sooner.       Influenza A by PCR NEGATIVE NEGATIVE Final   Influenza B by PCR NEGATIVE NEGATIVE Final    Comment: (NOTE) The Xpert Xpress SARS-CoV-2/FLU/RSV plus assay is intended as an aid in the diagnosis of influenza from Nasopharyngeal swab specimens and should not be used as a sole basis for treatment. Nasal washings and aspirates are unacceptable for Xpert Xpress SARS-CoV-2/FLU/RSV testing.  Fact Sheet for Patients: EntrepreneurPulse.com.au  Fact Sheet for Healthcare Providers: IncredibleEmployment.be  This test is not yet approved or cleared by the [place] FDA and has been authorized for detection and/or diagnosis of SARS-CoV-2 by FDA under an Emergency Use Authorization (EUA). This EUA will remain in effect (meaning this test can be used) for the duration of the COVID-19 declaration under Section 564(b)(1) of the Act, 21 U.S.C. section 360bbb-3(b)(1), unless the authorization is terminated or revoked.  Performed at [facility]  [facility], [address] [place], [place] [address]   MRSA Next Gen by PCR, Nasal     Status: None   Collection Time: 05/05/21  1:57 AM   Specimen: Nasal Mucosa; Nasal Swab  Result Value Ref Range Status    MRSA by PCR Next Gen NOT DETECTED NOT DETECTED Final    Comment: (NOTE) The GeneXpert MRSA Assay (FDA approved for NASAL specimens only), is one component of a comprehensive MRSA colonization surveillance program. It is not intended to diagnose MRSA infection nor to guide or monitor treatment for MRSA infections. Test performance is not FDA approved in patients less than 21 years old. Performed at [facility], [place] [address] [place], [place] [address]   Urine Culture     Status: None   Collection Time: 05/06/21  4:15 PM   Specimen: Urine, Clean Catch  Result Value Ref Range Status   Specimen Description URINE, CLEAN CATCH  Final   Special Requests NONE  Final   Culture   Final    NO GROWTH Performed at [facility], [place] [address], [place], [place] [address]    Report Status 05/07/2021 FINAL  Final    Radiology Studies: No results found.     Yamel Bale T. [place]  If 7PM-7AM, please contact night-coverage [email or web address] 05/08/2021, 3:47 PM

## 2021-05-08 NOTE — Interval H&P Note (Signed)
History and Physical Interval Note: Patient here for EGD to evaluate abnormal CT scan suggesting duodenitis / duodenal ulcer. He has had worsening of chronic normocytic anemia but denies any overt GI blood loss. He is feeling improved as he recovers from his infection, pyelonephritis. Endorses a colonoscopy in recent years which he thinks was normal. Got an RBC transfusion today for persistent anemia and Hgb in the 7s but again no bleeding. I have discussed risks / benefits of EGD and anesthesia with him and he wishes to proceed. All questions answered, further recommendations pending the results. Otherwise no interval changes since he was seen yesterday.  05/08/2021 1:40 PM  William Leonard  has presented today for surgery, with the diagnosis of Anemia, intermittent black stools, abnormal CT scan.  The various methods of treatment have been discussed with the patient and family. After consideration of risks, benefits and other options for treatment, the patient has consented to  Procedure(s): ESOPHAGOGASTRODUODENOSCOPY (EGD) WITH PROPOFOL (N/A) as a surgical intervention.  The patient's history has been reviewed, patient examined, no change in status, stable for surgery.  I have reviewed the patient's chart and labs.  Questions were answered to the patient's satisfaction.     [place]

## 2021-05-08 NOTE — Op Note (Addendum)
[facility] Patient Name: William Leonard Procedure Date : 05/08/2021 MRN: [id number] Attending MD: Carlota Raspberry. Mai Longnecker , MD Date of Birth: 1945/06/30 CSN: [id number] Age: 75 Admit Type: Inpatient Procedure:                Upper GI endoscopy Indications:              Abnormal CT of the GI tract - duodenal thickening,                            normocytic anemia with iron studies c/w chronic                            disease, no overt GI blood loss. On regular aspirin                            for history of CVA without GI prophylaxis Providers:                Remo Lipps P. Havery Moros, MD, Jerene Pitch Person, Benetta Spar, Technician Referring MD:              Medicines:                Monitored Anesthesia Care Complications:            No immediate complications. Estimated blood loss:                            Minimal. Estimated Blood Loss:     Estimated blood loss was minimal. Procedure:                Pre-Anesthesia Assessment:                           - Prior to the procedure, a History and Physical                            was performed, and patient medications and                            allergies were reviewed. The patient's tolerance of                            previous anesthesia was also reviewed. The risks                            and benefits of the procedure and the sedation                            options and risks were discussed with the patient.                            All questions were answered, and informed consent  was obtained. Prior Anticoagulants: The patient has                            taken no previous anticoagulant or antiplatelet                            agents. ASA Grade Assessment: III - A patient with                            severe systemic disease. After reviewing the risks                            and benefits, the patient was deemed in                             satisfactory condition to undergo the procedure.                           After obtaining informed consent, the endoscope was                            passed under direct vision. Throughout the                            procedure, the patient's blood pressure, pulse, and                            oxygen saturations were monitored continuously. The                            [id number]-[id number] ([id number]) Olympus endoscope was introduced                            through the mouth, and advanced to the second part                            of duodenum. The upper GI endoscopy was                            accomplished without difficulty. The patient                            tolerated the procedure well. Scope In: Scope Out: Findings:      Esophagogastric landmarks were identified: the Z-line was found at 42       cm, the gastroesophageal junction was found at 42 cm and the upper       extent of the gastric folds was found at 43 cm from the incisors.      A 1 cm hiatal hernia was present.      The exam of the esophagus was otherwise normal.      The entire examined stomach was normal. Biopsies were taken with a cold       forceps for Helicobacter pylori testing.      Four non-bleeding cratered (3) and superficial linear (1)  duodenal       ulcers with a clean ulcer base (Forrest Class III) were found in the       distal duodenal bulb entering the sweep. The largest lesion was 8 mm in       largest dimension, smaller lesions were perhaps 4-69mm in size. All clean       based. There was significant edema and luminal narrowing entering the       sweep due to this. The mucosa around this area was nodular and inflamed       appearing, I suspect benign inflammatory change but small biopsy was       taken with a cold forceps for histology to rule out dysplasia /       malignancy.      The exam of the duodenum was otherwise normal. Impression:               - Esophagogastric landmarks identified.                            - 1 cm hiatal hernia.                           - Normal esophagus otherwise                           - Normal stomach. Biopsied to rule out H pylori.                           - Non-bleeding duodenal ulcers with a clean ulcer                            base (Forrest Class III) associated with luminal                            narrowing as outlined and significant inflammatory                            change. Small biopsy obtained of nonulcerated                            nodular tissue to rule out malignancy, suspect                            benign inflammatory changes                           Duodenal ulcers could certainly be causing /                            contributing to worsening anemia although no overt                            blood loss reported, could be related to aspirin                            use although ruling out H pylori Recommendation:           -  Return patient to hospital ward for ongoing care.                           - Soft diet for now.                           - Continue present medications.                           - Protonix 40mg  PO BID for 6 weeks and then once                            daily thereafter. Would continue PPI indefinitely                            if he needs to use aspirin for history of CVA                           - can resume aspirin in next day or so if no overt                            blood loss and Hgb stable                           - NO OTHER NSAIDS                           - Would consider follow up EGD in a few months to                            assess for mucosal healing, can coordinate as                            outpatient (Dr. Loletha Carrow)                           - Await pathology results with further                            recommendations                           - We will sign off for now, if any overt blood loss                            or worsening anemia despite protonix  please contact                            [place] in the interim. Procedure Code(s):        --- Professional ---                           [phone], Esophagogastroduodenoscopy, flexible,  transoral; with biopsy, single or multiple Diagnosis Code(s):        --- Professional ---                           K44.9, Diaphragmatic hernia without obstruction or                            gangrene                           K26.9, Duodenal ulcer, unspecified as acute or                            chronic, without hemorrhage or perforation                           R93.3, Abnormal findings on diagnostic imaging of                            other parts of digestive tract CPT copyright 2019 American Medical Association. All rights reserved. The codes documented in this report are preliminary and upon coder review may  be revised to meet current compliance requirements. Remo Lipps P. Kie Calvin, MD 05/08/2021 2:21:32 PM This report has been signed electronically. Number of Addenda: 0

## 2021-05-09 DIAGNOSIS — N400 Enlarged prostate without lower urinary tract symptoms: Secondary | ICD-10-CM

## 2021-05-09 DIAGNOSIS — E872 Acidosis, unspecified: Secondary | ICD-10-CM

## 2021-05-09 DIAGNOSIS — E669 Obesity, unspecified: Secondary | ICD-10-CM

## 2021-05-09 DIAGNOSIS — N179 Acute kidney failure, unspecified: Secondary | ICD-10-CM

## 2021-05-09 DIAGNOSIS — Z6831 Body mass index (BMI) 31.0-31.9, adult: Secondary | ICD-10-CM

## 2021-05-09 LAB — TYPE AND SCREEN
ABO/RH(D): O POS
Antibody Screen: NEGATIVE
Unit division: 0

## 2021-05-09 LAB — RENAL FUNCTION PANEL
Albumin: 2.4 g/dL — ABNORMAL LOW (ref 3.5–5.0)
Anion gap: 7 (ref 5–15)
BUN: 20 mg/dL (ref 8–23)
CO2: 27 mmol/L (ref 22–32)
Calcium: 8.7 mg/dL — ABNORMAL LOW (ref 8.9–10.3)
Chloride: 108 mmol/L (ref 98–111)
Creatinine, Ser: 0.79 mg/dL (ref 0.61–1.24)
GFR, Estimated: >60 mL/min (ref >60)
Glucose, Bld: 89 mg/dL (ref 70–99)
Phosphorus: 3.5 mg/dL (ref 2.5–4.6)
Potassium: 3.5 mmol/L (ref 3.5–5.1)
Sodium: 142 mmol/L (ref 135–145)

## 2021-05-09 LAB — CULTURE, BLOOD (ROUTINE X 2)
Culture: NO GROWTH
Culture: NO GROWTH
Special Requests: ADEQUATE

## 2021-05-09 LAB — CBC
HCT: 25.9 % — ABNORMAL LOW (ref 39.0–52.0)
Hemoglobin: 8.2 g/dL — ABNORMAL LOW (ref 13.0–17.0)
MCH: 27.5 pg (ref 26.0–34.0)
MCHC: 31.7 g/dL (ref 30.0–36.0)
MCV: 86.9 fL (ref 80.0–100.0)
Platelets: 274 10*3/uL (ref 150–400)
RBC: 2.98 MIL/uL — ABNORMAL LOW (ref 4.22–5.81)
RDW: 16 % — ABNORMAL HIGH (ref 11.5–15.5)
WBC: 5.5 10*3/uL (ref 4.0–10.5)
nRBC: 0 % (ref 0.0–0.2)

## 2021-05-09 LAB — SURGICAL PATHOLOGY

## 2021-05-09 LAB — MAGNESIUM: Magnesium: 1.9 mg/dL (ref 1.7–2.4)

## 2021-05-09 LAB — BPAM RBC
Blood Product Expiration Date: 202301022359
ISSUE DATE / TIME: 202212121114
Unit Type and Rh: 5100

## 2021-05-09 MED ORDER — POTASSIUM CHLORIDE CRYS ER 20 MEQ PO TBCR
40.0000 meq | EXTENDED_RELEASE_TABLET | Freq: Once | ORAL | Status: AC
  Administered 2021-05-09: 40 meq via ORAL
  Filled 2021-05-09: qty 2

## 2021-05-09 MED ORDER — CEFADROXIL 500 MG PO CAPS: 500.0000 mg | ORAL_CAPSULE | Freq: Two times a day (BID) | ORAL | 0 refills | Status: DC

## 2021-05-09 MED ORDER — PANTOPRAZOLE SODIUM 40 MG PO TBEC: DELAYED_RELEASE_TABLET | ORAL | 0 refills | Status: DC

## 2021-05-09 NOTE — TOC Transition Note (Signed)
Transition of Care [facility]) - CM/SW Discharge Note   Patient Details  Name: William Leonard MRN: [id number] Date of Birth: 12-22-1945  [facility] CM/SW Contact:  Pollie Friar, RN Phone Number: 05/09/2021, 9:18 AM   Clinical Narrative:    Patient is from home with spouse and discharging home today. No f/u per PT.  Pt has transport home. No needs per TOC.   Final next level of care: Home/Self Care Barriers to Discharge: No Barriers Identified   Patient Goals and CMS Choice        Discharge Placement                       Discharge Plan and Services                                     Social Determinants of Health (SDOH) Interventions     Readmission Risk Interventions No flowsheet data found.

## 2021-05-09 NOTE — Plan of Care (Signed)

## 2021-05-09 NOTE — Progress Notes (Signed)
OT Cancellation Note  Patient Details Name: William Leonard MRN: [id number] DOB: 12-26-1945   Cancelled Treatment:    Reason Eval/Treat Not Completed: OT screened, no needs identified, will sign off (Pt does not have acute OT need at this time. Thank you for the referal.)  [name] 05/09/2021, 9:03 AM

## 2021-05-09 NOTE — Discharge Summary (Signed)
Physician Discharge Summary  William Leonard YIR:[id number] DOB: 06-29-1945 DOA: 05/04/2021  PCP: Manon Hilding, MD  Admit date: 05/04/2021 Discharge date: 05/09/2021 Admitted From: Home Disposition: Home Recommendations for Outpatient Follow-up:  Follow ups as below. GI to arrange outpatient follow-up. Outpatient follow-up with urology as previously planned Please obtain CBC/BMP/Mag at follow up Please follow up on the following pending results: Pathology of gastric and duodenal biopsy Home Health: Not indicated Equipment/Devices: Not indicated Discharge Condition: Stable CODE STATUS: Full code  Follow-up Information     Sasser, Silvestre Moment, MD. Schedule an appointment as soon as possible for a visit in 1 week(s).   Specialty: Family Medicine Contact information: [place] [address] [phone]                Hospital Course: 75 year old male with PMH of left ureteral stone s/p stent in 01/2021, recurrent urosepsis, right MCA aneurysm, alcohol abuse, HTN and HLD who presented to [facility] with progressive generalized weakness, fever, nausea for 2 days, and admitted to [facility] ICU for septic shock due to possible acute pyelonephritis, and anemia.  Febrile to 102.6.  Had no leukocytosis but lactic acid to 3.1.  Also anemic to 6.7.  UA without significant finding.  CT abdomen and pelvis concerning for duodenitis and mild hydroureter with periureteral fat stranding around ureteral stent.  Cultures obtained.  He was started on Levophed and broad-spectrum antibiotics.    Patient came off vasopressor.  Vancomycin discontinued.  Antibiotic de-escalated to IV cefepime.  Transferred to [facility] on 05/07/2021.  Blood nd urine culture NGTD.   Antibiotics de-escalated further to cefadroxil, and discharged on this for 7 more days.   EGD on 05/08/2021 showed non-bleeding duodenal ulcers with a clean ulcer base (Forrest Class III) associated with luminal narrowing as outlined  and significant inflammatory change.  GI recommended p.o. Protonix 40 mg twice daily for 6 weeks followed by daily.  Aspirin discontinued.  No clear indication.  Recommend checking CBC in 1 week.  GI to arrange outpatient follow-up.  See individual problem list below for more on hospital course.  Discharge Diagnoses:  Septic shock due to possible pyelonephritis-patient with recurrent UTI/sepsis.  Recently treated for pansensitive E. coli UTI and bacteremia with nephrolithiasis.  Cultures negative this time.  Sepsis physiology resolved. -Discharged on p.o. cefadroxil for 7 more days to complete a total of 10 days   History of ureteral stone s/p ureteral stent in 01/2021: Definitive stone treatment and stent removal delayed until he is treated for right MCA aneurysm. -Antibiotics as above -Outpatient follow-up with urology as previously planned   Acute on chronic blood loss anemia: Baseline Hgb 10-11.  Concern about upper GI bleed given melena and NSAID use (full dose aspirin).  EGD as above.  Anemia panel with some iron deficiency with low normal ferritin of 62.  Transfused 3 units total.  No further clinical GI bleed.  Aspirin discontinued. Recent Labs    04/04/21 0937 05/04/21 1937 05/05/21 0744 05/05/21 1715 05/06/21 0735 05/07/21 0110 05/08/21 0129 05/09/21 0305  HGB 8.6* 6.7* 5.9* 7.7* 7.8* 7.4* 7.3* 8.2*  -Check CBC at follow-up. -P.o. Protonix 40 mg twice daily for 6 weeks followed by daily -GI to follow-up on gastric and duodenal pathology and arrange outpatient follow-up. -Advised to avoid any NSAIDs   Possible duodenitis: EGD as above. -Continue PPI as above   AKI/azotemia: Resolved. Recent Labs    04/04/21 [phone] 05/04/21 1937 05/05/21 0744 05/06/21 0735 05/07/21 0110  05/08/21 0129 05/09/21 0305  BUN 34* 28* 22 20 18 22 20   CREATININE 1.24 1.38* 1.21 1.04 0.99 1.05 0.79     Right MCA aneurysm: craniotomy and clipping rescheduled for 06/08/2021.    BPH/phimosis -Continue home Flomax. -Outpatient follow-up with urology.   Hypokalemia: K 3.5. -P.o. KCl 40 prior to discharge   Hyponatremia: Resolved.   Lactic acidosis: Resolved.   Elevated liver enzymes: Resolved.  Class I obesity Body mass index is 31.31 kg/m.           Discharge Exam: Vitals:   05/08/21 1615 05/08/21 2024 05/09/21 0510 05/09/21 0925  BP: (!) 141/83 101/60 (!) 118/55 123/74  Pulse: 77 79 72 77  Temp: 98.2 F (36.8 C) 98 F (36.7 C) (!) 97.3 F (36.3 C) 98.2 F (36.8 C)  Resp: (!) 22  18 18   Height:      Weight:   110.6 kg   SpO2: 98% 95% 96% 96%  TempSrc: Oral Oral Oral Oral  BMI (Calculated):   31.29      GENERAL: No apparent distress.  Nontoxic. HEENT: MMM.  Vision and hearing grossly intact.  NECK: Supple.  No apparent JVD.  RESP: 96% on RA.  No IWOB.  Fair aeration bilaterally. CVS:  RRR. Heart sounds normal.  ABD/GI/GU: Bowel sounds present. Soft. Non tender.  MSK/EXT:  Moves extremities. No apparent deformity. No edema.  SKIN: no apparent skin lesion or wound NEURO: Awake and alert.  Oriented appropriately.  No apparent focal neuro deficit. PSYCH: Calm. Normal affect.   Discharge Instructions  Discharge Instructions     Diet - low sodium heart healthy   Complete by: As directed    Discharge instructions   Complete by: As directed    It has been a pleasure taking care of you!  You were hospitalized due to severe sepsis from possible kidney infection, and anemia.  You have been treated medically with improvement in his symptoms.  We are discharging you more antibiotics to complete treatment course for infection.  Please follow-up with your primary care doctor and neurologist in about 1 to 2 weeks. Review your new medication list and the directions on your medications before you take them.   Take care,   Increase activity slowly   Complete by: As directed       Allergies as of 05/09/2021       Reactions   Other Nausea  And Vomiting   Blue cheese   Shellfish Allergy Nausea And Vomiting   mussels        Medication List     STOP taking these medications    aspirin 325 MG EC tablet       TAKE these medications    cefadroxil 500 MG capsule Commonly known as: DURICEF Take 1 capsule (500 mg total) by mouth 2 (two) times daily.   docusate sodium 100 MG capsule Commonly known as: COLACE Take 300 mg by mouth at bedtime.   multivitamin with minerals Tabs tablet Take 1 tablet by mouth daily.   nystatin ointment Commonly known as: MYCOSTATIN Apply 1 application topically 2 (two) times daily.   pantoprazole 40 MG tablet Commonly known as: PROTONIX Take 1 tablet (40 mg total) by mouth 2 (two) times daily for 42 days, THEN 1 tablet (40 mg total) daily. Start taking on: May 09, 2021   tamsulosin 0.4 MG Caps capsule Commonly known as: FLOMAX Take 0.4 mg by mouth every other day.   triamcinolone cream 0.1 % Commonly known  as: KENALOG Apply 1 application topically daily.        Consultations: Gastroenterology  Procedures/Studies: EGD on 05/08/2021 showed non-bleeding duodenal ulcers with a clean ulcer base (Forrest Class III) associated with luminal narrowing as outlined and significant inflammatory change.    CT Abdomen Pelvis Wo Contrast  Result Date: 05/04/2021 CLINICAL DATA:  Abdominal pain, acute, nonlocalized sepsis, abdominal pain with nausea/vomiting, low GFR EXAM: CT ABDOMEN AND PELVIS WITHOUT CONTRAST TECHNIQUE: Multidetector CT imaging of the abdomen and pelvis was performed following the standard protocol without IV contrast. COMPARISON:  None. FINDINGS: Lower chest: Bilateral lower lobe atelectasis. Interlobular septal wall thickening. Severe mitral annular calcifications. Findings suggestive of anemia. Hepatobiliary: Redemonstration of a approximately 3.5 cm hypodense lesion within left hepatic lobe. Otherwise no focal liver abnormality. Punctate calcified stone within  the gallbladder lumen. No gallbladder wall thickening or pericholecystic fluid. No biliary dilatation. Pancreas: No focal lesion. Normal pancreatic contour. No surrounding inflammatory changes. No main pancreatic ductal dilatation. Spleen: Normal in size without focal abnormality. Adrenals/Urinary Tract: No adrenal nodule bilaterally. Left ureteral stent with proximal pigtail within the left renal pelvis and distal pigtail within the urinary bladder lumen. Associated mild hydroureter with periureteral fat stranding. No left ureterolithiasis. No left nephrolithiasis and no left hydronephrosis. A 2.8 cm fluid density lesion within the right kidney likely represents a simple renal cyst. No right ureterolithiasis or hydroureter. Punctate right nephrolithiasis. No right hydroureter. 4 mm calcified stone within the urinary bladder lumen. The urinary bladder is unremarkable. Stomach/Bowel: Stomach is within normal limits. Bowel thickening in adjacent fat stranding of the first and second portion of the duodenum. No pneumatosis. No evidence of large bowel wall thickening or dilatation. Scattered colonic diverticulosis. Under distended rectum. Appendix appears normal. Vascular/Lymphatic: No abdominal aorta or iliac aneurysm. At least moderate atherosclerotic plaque of the aorta and its branches. A 1.3 cm aortocaval lymph node is noted (2:39). Several prominent upper abdominal lymph nodes are noted. Likely borderline enlarged 1.1 cm porta hepatic lymph node (2:31) no pelvic or inguinal lymphadenopathy. Reproductive: Prostate is unremarkable. Other: No intraperitoneal free fluid. No intraperitoneal free gas. No organized fluid collection. Musculoskeletal: No abdominal wall hernia or abnormality. No suspicious lytic or blastic osseous lesions. No acute displaced fracture. Levoscoliosis centered at the L3-L4 level with associated degenerative changes. IMPRESSION: 1. Duodenitis of the first and second portion of the duodenum. No  associated finding of perforation. Underlying ulcer not excluded. Underlying malignancy not excluded. Markedly limited evaluation for ischemia on this noncontrast study with no pneumatosis identified. 2. Likely reactive upper abdominal and retroperitoneal lymph nodes. Recommend attention on follow-up. 3. Left ureteral stent with proximal pigtail within the left renal pelvis and distal pigtail within the urinary bladder lumen. Associated mild hydroureter with periureteral fat stranding. Correlate with urinalysis for infection. 4. Cholelithiasis no CT findings of acute cholecystitis. 5. Scattered colonic diverticulosis with no acute diverticulitis. 6. A 4 mm calcified stone within the urinary bladder lumen. 7.  Aortic Atherosclerosis (ICD10-I70.0). Electronically Signed   By: Iven Finn M.D.   On: 05/04/2021 22:42   DG Chest Port 1 View  Result Date: 05/04/2021 CLINICAL DATA:  Questionable sepsis. EXAM: PORTABLE CHEST 1 VIEW COMPARISON:  Chest x-ray 03/19/2021. FINDINGS: The cardiac silhouette is mildly enlarged, unchanged. There are diffuse mild interstitial opacities throughout both lungs. Costophrenic angles are clear. No pneumothorax. No acute fractures. IMPRESSION: 1. Cardiomegaly with likely mild interstitial edema. Infection can not be excluded. Electronically Signed   By: Tina Griffiths.D.  On: 05/04/2021 20:34   ECHOCARDIOGRAM COMPLETE  Result Date: 05/06/2021    ECHOCARDIOGRAM REPORT   Patient Name:   BRAULIO KIEDROWSKI Date of Exam: 05/06/2021 Medical Rec #:  [id number]        Height:       74.0 in Accession #:    [id number]       Weight:       244.5 lb Date of Birth:  1945/12/25         BSA:          2.367 m Patient Age:    36 years         BP:           106/63 mmHg Patient Gender: M                HR:           86 bpm. Exam Location:  Inpatient Procedure: 2D Echo, Cardiac Doppler, Color Doppler and Intracardiac            Opacification Agent Indications:     Cardiomegaly I51.7  History:          Patient has no prior history of Echocardiogram examinations.                  Signs/Symptoms:Shortness of Breath and Hypotension.  Sonographer:     Merrie Roof RDCS Referring Phys:  [id number] Candee Furbish Diagnosing Phys: Dixie Dials MD IMPRESSIONS  1. Left ventricular ejection fraction, by estimation, is 55 to 60%. The left ventricle has normal function. The left ventricle has no regional wall motion abnormalities. There is mild concentric left ventricular hypertrophy. Left ventricular diastolic parameters are consistent with Grade I diastolic dysfunction (impaired relaxation).  2. Right ventricular systolic function is normal. The right ventricular size is normal.  3. Left atrial size was mildly dilated.  4. Right atrial size was mildly dilated.  5. The mitral valve is degenerative. Mild mitral valve regurgitation. Moderate mitral annular calcification.  6. The aortic valve is tricuspid. There is severe calcifcation of the aortic valve. There is moderate thickening of the aortic valve. Aortic valve regurgitation is not visualized. Mild aortic valve stenosis.  7. There is mild (Grade II) atheroma plaque involving the aortic root and ascending aorta.  8. The inferior vena cava is dilated in size with >50% respiratory variability, suggesting right atrial pressure of 8 mmHg. FINDINGS  Left Ventricle: Left ventricular ejection fraction, by estimation, is 55 to 60%. The left ventricle has normal function. The left ventricle has no regional wall motion abnormalities. The left ventricular internal cavity size was normal in size. There is  mild concentric left ventricular hypertrophy. Left ventricular diastolic parameters are consistent with Grade I diastolic dysfunction (impaired relaxation). Right Ventricle: The right ventricular size is normal. No increase in right ventricular wall thickness. Right ventricular systolic function is normal. Left Atrium: Left atrial size was mildly dilated. Right Atrium: Right atrial  size was mildly dilated. Pericardium: There is no evidence of pericardial effusion. Mitral Valve: The mitral valve is degenerative in appearance. There is mild thickening of the mitral valve leaflet(s). There is moderate calcification of the mitral valve leaflet(s). Moderate mitral annular calcification. Mild mitral valve regurgitation. Tricuspid Valve: The tricuspid valve is normal in structure. Tricuspid valve regurgitation is mild. Aortic Valve: The aortic valve is tricuspid. There is severe calcifcation of the aortic valve. There is moderate thickening of the aortic valve. There is moderate aortic valve annular calcification. Aortic valve  regurgitation is not visualized. Mild aortic stenosis is present. Aortic valve mean gradient measures 9.0 mmHg. Aortic valve peak gradient measures 16.2 mmHg. Aortic valve area, by VTI measures 2.49 cm. Pulmonic Valve: The pulmonic valve was normal in structure. Pulmonic valve regurgitation is not visualized. Aorta: The aortic root is normal in size and structure. There is mild (Grade II) atheroma plaque involving the aortic root and ascending aorta. Venous: The inferior vena cava is dilated in size with greater than 50% respiratory variability, suggesting right atrial pressure of 8 mmHg. IAS/Shunts: The atrial septum is grossly normal.  LEFT VENTRICLE PLAX 2D LVIDd:         5.00 cm   Diastology LVIDs:         3.30 cm   LV e' medial:    8.16 cm/s LV PW:         1.20 cm   LV E/e' medial:  13.5 LV IVS:        1.20 cm   LV e' lateral:   7.07 cm/s LVOT diam:     2.20 cm   LV E/e' lateral: 15.6 LV SV:         90 LV SV Index:   38 LVOT Area:     3.80 cm  RIGHT VENTRICLE          IVC RV Basal diam:  4.10 cm  IVC diam: 2.90 cm RV Mid diam:    3.40 cm LEFT ATRIUM              Index        RIGHT ATRIUM           Index LA diam:        3.70 cm  1.56 cm/m   RA Area:     26.00 cm LA Vol (A2C):   111.0 ml 46.89 ml/m  RA Volume:   80.20 ml  33.88 ml/m LA Vol (A4C):   92.8 ml  39.20 ml/m  LA Biplane Vol: 103.0 ml 43.51 ml/m  AORTIC VALVE AV Area (Vmax):    2.31 cm AV Area (Vmean):   2.36 cm AV Area (VTI):     2.49 cm AV Vmax:           201.00 cm/s AV Vmean:          134.000 cm/s AV VTI:            0.361 m AV Peak Grad:      16.2 mmHg AV Mean Grad:      9.0 mmHg LVOT Vmax:         122.00 cm/s LVOT Vmean:        83.300 cm/s LVOT VTI:          0.236 m LVOT/AV VTI ratio: 0.65  AORTA Ao Root diam: 3.60 cm MITRAL VALVE MV Area (PHT): 3.42 cm     SHUNTS MV Decel Time: 222 msec     Systemic VTI:  0.24 m MV E velocity: 110.00 cm/s  Systemic Diam: 2.20 cm MV A velocity: 118.00 cm/s MV E/A ratio:  0.93 Dixie Dials MD Electronically signed by Dixie Dials MD Signature Date/Time: 05/06/2021/3:38:57 PM    Final        The results of significant diagnostics from this hospitalization (including imaging, microbiology, ancillary and laboratory) are listed below for reference.     Microbiology: Recent Results (from the past 240 hour(s))  Blood Culture (routine x 2)     Status: None   Collection Time: 05/04/21  7:37 PM   Specimen: Right Antecubital; Blood  Result Value Ref Range Status   Specimen Description RIGHT ANTECUBITAL  Final   Special Requests   Final    BOTTLES DRAWN AEROBIC AND ANAEROBIC Blood Culture results may not be optimal due to an excessive volume of blood received in culture bottles   Culture   Final    NO GROWTH 5 DAYS Performed at [facility], [address] [place], [place] [address]    Report Status 05/09/2021 FINAL  Final  Blood Culture (routine x 2)     Status: None   Collection Time: 05/04/21  7:37 PM   Specimen: BLOOD RIGHT FOREARM  Result Value Ref Range Status   Specimen Description BLOOD RIGHT FOREARM  Final   Special Requests   Final    BOTTLES DRAWN AEROBIC AND ANAEROBIC Blood Culture adequate volume   Culture   Final    NO GROWTH 5 DAYS Performed at [facility], [address] [place], [place] [address]    Report Status 05/09/2021 FINAL  Final   Resp Panel by RT-PCR (Flu A&B, Covid) Nasopharyngeal Swab     Status: None   Collection Time: 05/04/21  7:41 PM   Specimen: Nasopharyngeal Swab; Nasopharyngeal(NP) swabs in vial transport medium  Result Value Ref Range Status   SARS Coronavirus 2 by RT PCR NEGATIVE NEGATIVE Final    Comment: (NOTE) SARS-CoV-2 target nucleic acids are NOT DETECTED.  The SARS-CoV-2 RNA is generally detectable in upper respiratory specimens during the acute phase of infection. The lowest concentration of SARS-CoV-2 viral copies this assay can detect is 138 copies/mL. A negative result does not preclude SARS-Cov-2 infection and should not be used as the sole basis for treatment or other patient management decisions. A negative result may occur with  improper specimen collection/handling, submission of specimen other than nasopharyngeal swab, presence of viral mutation(s) within the areas targeted by this assay, and inadequate number of viral copies(<138 copies/mL). A negative result must be combined with clinical observations, patient history, and epidemiological information. The expected result is Negative.  Fact Sheet for Patients:  EntrepreneurPulse.com.au  Fact Sheet for Healthcare Providers:  IncredibleEmployment.be  This test is no t yet approved or cleared by the [place] FDA and  has been authorized for detection and/or diagnosis of SARS-CoV-2 by FDA under an Emergency Use Authorization (EUA). This EUA will remain  in effect (meaning this test can be used) for the duration of the COVID-19 declaration under Section 564(b)(1) of the Act, 21 U.S.C.section 360bbb-3(b)(1), unless the authorization is terminated  or revoked sooner.       Influenza A by PCR NEGATIVE NEGATIVE Final   Influenza B by PCR NEGATIVE NEGATIVE Final    Comment: (NOTE) The Xpert Xpress SARS-CoV-2/FLU/RSV plus assay is intended as an aid in the diagnosis of influenza from  Nasopharyngeal swab specimens and should not be used as a sole basis for treatment. Nasal washings and aspirates are unacceptable for Xpert Xpress SARS-CoV-2/FLU/RSV testing.  Fact Sheet for Patients: EntrepreneurPulse.com.au  Fact Sheet for Healthcare Providers: IncredibleEmployment.be  This test is not yet approved or cleared by the [place] FDA and has been authorized for detection and/or diagnosis of SARS-CoV-2 by FDA under an Emergency Use Authorization (EUA). This EUA will remain in effect (meaning this test can be used) for the duration of the COVID-19 declaration under Section 564(b)(1) of the Act, 21 U.S.C. section 360bbb-3(b)(1), unless the authorization is terminated or revoked.  Performed at [facility], [phone]  [address], [place], [place] [address]   MRSA Next Gen by PCR, Nasal     Status: None   Collection Time: 05/05/21  1:57 AM   Specimen: Nasal Mucosa; Nasal Swab  Result Value Ref Range Status   MRSA by PCR Next Gen NOT DETECTED NOT DETECTED Final    Comment: (NOTE) The GeneXpert MRSA Assay (FDA approved for NASAL specimens only), is one component of a comprehensive MRSA colonization surveillance program. It is not intended to diagnose MRSA infection nor to guide or monitor treatment for MRSA infections. Test performance is not FDA approved in patients less than 22 years old. Performed at [facility], [place] [address] [place], [place] [address]   Urine Culture     Status: None   Collection Time: 05/06/21  4:15 PM   Specimen: Urine, Clean Catch  Result Value Ref Range Status   Specimen Description URINE, CLEAN CATCH  Final   Special Requests NONE  Final   Culture   Final    NO GROWTH Performed at [facility], [place] [address] [place], [place] [address]    Report Status 05/07/2021 FINAL  Final     Labs:  CBC: Recent Labs  Lab 05/04/21 1937 05/05/21 0744 05/05/21 1715 05/06/21 0735 05/07/21 0110  05/08/21 0129 05/09/21 0305  WBC 5.9 6.8  --  8.8 5.8 5.1 5.5  NEUTROABS 5.3  --   --   --   --   --   --   HGB 6.7* 5.9* 7.7* 7.8* 7.4* 7.3* 8.2*  HCT 21.3* 19.0* 23.4* 24.5* 23.9* 23.5* 25.9*  MCV 86.9 85.2  --  85.4 86.9 86.4 86.9  PLT 207 206  --  247 246 262 274   BMP &GFR Recent Labs  Lab 05/05/21 0744 05/06/21 0735 05/07/21 0110 05/08/21 0129 05/09/21 0305  NA 136 139 139 141 142  K 3.6 3.6 3.3* 3.2* 3.5  CL 104 108 106 105 108  CO2 22 24 26 27 27   GLUCOSE 101* 133* 104* 105* 89  BUN 22 20 18 22 20   CREATININE 1.21 1.04 0.99 1.05 0.79  CALCIUM 8.2* 8.5* 8.4* 8.6* 8.7*  MG 2.0  --   --  2.1 1.9  PHOS 3.9  --   --  3.4 3.5   Estimated Creatinine Clearance: 102.4 mL/min (by C-G formula based on SCr of 0.79 mg/dL). Liver & Pancreas: Recent Labs  Lab 05/04/21 1937 05/08/21 0129 05/09/21 0305  AST 57* 24  --   ALT 64* 32  --   ALKPHOS 277* 151*  --   BILITOT 0.2* 0.2*  --   PROT 6.7 5.7*  --   ALBUMIN 3.1* 2.3* 2.4*   No results for input(s): LIPASE, AMYLASE in the last 168 hours. No results for input(s): AMMONIA in the last 168 hours. Diabetic: Recent Labs    05/08/21 0129  HGBA1C 5.1   Recent Labs  Lab 05/05/21 0210 05/05/21 0323  GLUCAP 126* 109*   Cardiac Enzymes: Recent Labs  Lab 05/08/21 0129  CKTOTAL 25*   No results for input(s): PROBNP in the last 8760 hours. Coagulation Profile: Recent Labs  Lab 05/04/21 1937  INR 1.2   Thyroid Function Tests: No results for input(s): TSH, T4TOTAL, FREET4, T3FREE, THYROIDAB in the last 72 hours. Lipid Profile: No results for input(s): CHOL, HDL, LDLCALC, TRIG, CHOLHDL, LDLDIRECT in the last 72 hours. Anemia Panel: Recent Labs    05/08/21 0129  VITAMINB12 342  FOLATE 11.2  FERRITIN 62  TIBC 227*  IRON 20*  RETICCTPCT 0.9   Urine analysis:    Component Value Date/Time   COLORURINE YELLOW 05/05/2021 0913   APPEARANCEUR CLEAR 05/05/2021 0913   APPEARANCEUR Clear 04/06/2021 1340    LABSPEC 1.010 05/05/2021 0913   PHURINE 6.0 05/05/2021 0913   GLUCOSEU NEGATIVE 05/05/2021 0913   HGBUR MODERATE (A) 05/05/2021 0913   BILIRUBINUR NEGATIVE 05/05/2021 0913   BILIRUBINUR Negative 04/06/2021 1340   KETONESUR NEGATIVE 05/05/2021 0913   PROTEINUR 30 (A) 05/05/2021 0913   NITRITE NEGATIVE 05/05/2021 0913   LEUKOCYTESUR NEGATIVE 05/05/2021 0913   Sepsis Labs: Invalid input(s): PROCALCITONIN, LACTICIDVEN   Time coordinating discharge: 55 minutes  SIGNED:  Mercy Riding, MD  [facility] 05/09/2021, 10:17 PM

## 2021-05-09 NOTE — Evaluation (Signed)
Physical Therapy Evaluation and Discharge Patient Details Name: William Leonard MRN: [id number] DOB: 08-22-1945 Today's Date: 05/09/2021  History of Present Illness  Pt is a 75 y/o male admitted 12/8 secondary to urosepsis, septic shock and anemia. Pt is s/p EGD on 12/12. PMH includes HTN, alcohol abuse, DM, HTN, CVA, and recurrent urosepsis.  Clinical Impression  Patient evaluated by Physical Therapy with no further acute PT needs identified. All education has been completed and the patient has no further questions. Pt overall steady and at a mod I level for mobility tasks. Pt with mild fatigue, but did not seem to affect mobility. Discussed activity pacing for home. See below for any follow-up Physical Therapy or equipment needs. PT is signing off. Thank you for this referral. If needs change, please re-consult.         Recommendations for follow up therapy are one component of a multi-disciplinary discharge planning process, led by the attending physician.  Recommendations may be updated based on patient status, additional functional criteria and insurance authorization.  Follow Up Recommendations No PT follow up    Assistance Recommended at Discharge PRN  Functional Status Assessment Patient has had a recent decline in their functional status and demonstrates the ability to make significant improvements in function in a reasonable and predictable amount of time.  Equipment Recommendations  None recommended by PT    Recommendations for Other Services       Precautions / Restrictions Precautions Precautions: None Restrictions Weight Bearing Restrictions: No      Mobility  Bed Mobility               General bed mobility comments: In chair upon entry    Transfers Overall transfer level: Modified independent                      Ambulation/Gait Ambulation/Gait assistance: Modified independent (Device/Increase time) Gait Distance (Feet): 150 Feet Assistive  device: None Gait Pattern/deviations: Step-through pattern;Decreased stride length Gait velocity: Decreased     General Gait Details: Slower gait and mild SOB, but no LOB noted.  Stairs            Wheelchair Mobility    Modified Rankin (Stroke Patients Only)       Balance Overall balance assessment: Mild deficits observed, not formally tested                                           Pertinent Vitals/Pain Pain Assessment: No/denies pain    Home Living Family/patient expects to be discharged to:: Private residence Living Arrangements: Spouse/significant other Available Help at Discharge: Family Type of Home: House Home Access: Stairs to enter Entrance Stairs-Rails: Psychiatric nurse of Steps: 4   Home Layout: One level Home Equipment: Conservation officer, nature (2 wheels);Cane - single point;Shower seat;Toilet riser      Prior Function Prior Level of Function : Independent/Modified Independent                     Hand Dominance        Extremity/Trunk Assessment   Upper Extremity Assessment Upper Extremity Assessment: Overall WFL for tasks assessed    Lower Extremity Assessment Lower Extremity Assessment: Generalized weakness    Cervical / Trunk Assessment Cervical / Trunk Assessment: Normal  Communication   Communication: No difficulties  Cognition Arousal/Alertness: Awake/alert Behavior During Therapy: WFL for  tasks assessed/performed Overall Cognitive Status: Within Functional Limits for tasks assessed                                          General Comments General comments (skin integrity, edema, etc.): Discussed activity pacing to increase safety at home    Exercises     Assessment/Plan    PT Assessment Patient does not need any further PT services  PT Problem List         PT Treatment Interventions      PT Goals (Current goals can be found in the Care Plan section)  Acute Rehab PT  Goals Patient Stated Goal: to go home today PT Goal Formulation: With patient Time For Goal Achievement: 05/09/21 Potential to Achieve Goals: Good    Frequency     Barriers to discharge        Co-evaluation               AM-PAC PT "6 Clicks" Mobility  Outcome Measure Help needed turning from your back to your side while in a flat bed without using bedrails?: None Help needed moving from lying on your back to sitting on the side of a flat bed without using bedrails?: None Help needed moving to and from a bed to a chair (including a wheelchair)?: None Help needed standing up from a chair using your arms (e.g., wheelchair or bedside chair)?: None Help needed to walk in hospital room?: None Help needed climbing 3-5 steps with a railing? : A Little 6 Click Score: 23    End of Session   Activity Tolerance: Patient tolerated treatment well Patient left: in chair;with call bell/phone within reach Nurse Communication: Mobility status PT Visit Diagnosis: Other abnormalities of gait and mobility (R26.89);Muscle weakness (generalized) (M62.81)    Time: 7209-4709 PT Time Calculation (min) (ACUTE ONLY): 10 min   Charges:   PT Evaluation $PT Eval Low Complexity: 1 Low          Lou Miner, DPT  Acute Rehabilitation Services  Pager: [phone] Office: [phone]   Rudean Hitt 05/09/2021, 8:16 AM

## 2021-05-10 ENCOUNTER — Encounter (HOSPITAL_COMMUNITY): Payer: Self-pay | Admitting: Gastroenterology

## 2021-05-12 ENCOUNTER — Telehealth: Payer: Self-pay

## 2021-05-12 NOTE — Telephone Encounter (Signed)
Patient wanting to know if he still needs to come to his appt with Dr. Felipa Eth on 05-24-21.   He wants to have Aneurysm surgery before any procedures.   Please advise.  Thanks, Helene Kelp

## 2021-05-15 ENCOUNTER — Telehealth: Payer: Self-pay | Admitting: Gastroenterology

## 2021-05-15 NOTE — Telephone Encounter (Signed)
Called and spoke with patient's wife about pathology results and recommendations. Pt's wife requested that a copy of results be faxed to PCP and mailed to their home. Pt's been scheduled for a follow up with Dr.Danis on 06/19/21 at 2 pm. She verbalized understanding and had no other concerns.   EGD report and results faxed to PCP via epic.   Pathology results and appt information mailed to patient.

## 2021-05-15 NOTE — Telephone Encounter (Signed)
Inbound call from patients wife, requesting results from Path and also had a few questions in regards to a biopsy. Please advise.

## 2021-05-17 DIAGNOSIS — E559 Vitamin D deficiency, unspecified: Secondary | ICD-10-CM | POA: Diagnosis not present

## 2021-05-17 DIAGNOSIS — E782 Mixed hyperlipidemia: Secondary | ICD-10-CM | POA: Diagnosis not present

## 2021-05-17 DIAGNOSIS — D649 Anemia, unspecified: Secondary | ICD-10-CM | POA: Diagnosis not present

## 2021-05-17 DIAGNOSIS — Z23 Encounter for immunization: Secondary | ICD-10-CM | POA: Diagnosis not present

## 2021-05-17 DIAGNOSIS — N200 Calculus of kidney: Secondary | ICD-10-CM | POA: Diagnosis not present

## 2021-05-17 DIAGNOSIS — A419 Sepsis, unspecified organism: Secondary | ICD-10-CM | POA: Diagnosis not present

## 2021-05-17 DIAGNOSIS — E7849 Other hyperlipidemia: Secondary | ICD-10-CM | POA: Diagnosis not present

## 2021-05-17 DIAGNOSIS — K269 Duodenal ulcer, unspecified as acute or chronic, without hemorrhage or perforation: Secondary | ICD-10-CM | POA: Diagnosis not present

## 2021-05-17 DIAGNOSIS — E668 Other obesity: Secondary | ICD-10-CM | POA: Diagnosis not present

## 2021-05-17 NOTE — Telephone Encounter (Signed)
Patient called with no answer. Message left to return call to office. 

## 2021-05-23 ENCOUNTER — Telehealth: Payer: Self-pay

## 2021-05-23 NOTE — Telephone Encounter (Signed)
Per wife patient is scheduled for surgery for aneurysm on 06/08/2021. Wife would like to wait several weeks  to come back to see Dr. Felipa Eth afterwards.

## 2021-05-23 NOTE — Telephone Encounter (Signed)
-----   Message from Primus Bravo, MD sent at 05/23/2021 10:13 AM EST ----- Regarding: RE: appt Was his aneurysm surgery rescheduled?  If so, we can reschedule his appt for 7-10 days after to make arrangements for his stone procedure.  ----- Message ----- From: Dorisann Frames, RN Sent: 05/23/2021  10:11 AM EST To: Primus Bravo, MD Subject: appt                                           Will William Leonard still need his appt for tomorrow? His wife called and asked.

## 2021-05-24 ENCOUNTER — Ambulatory Visit: Payer: Medicare HMO | Admitting: Urology

## 2021-05-26 DIAGNOSIS — I10 Essential (primary) hypertension: Secondary | ICD-10-CM | POA: Diagnosis not present

## 2021-05-26 DIAGNOSIS — E7800 Pure hypercholesterolemia, unspecified: Secondary | ICD-10-CM | POA: Diagnosis not present

## 2021-05-26 DIAGNOSIS — H524 Presbyopia: Secondary | ICD-10-CM | POA: Diagnosis not present

## 2021-05-26 DIAGNOSIS — E1129 Type 2 diabetes mellitus with other diabetic kidney complication: Secondary | ICD-10-CM | POA: Diagnosis not present

## 2021-05-26 DIAGNOSIS — E782 Mixed hyperlipidemia: Secondary | ICD-10-CM | POA: Diagnosis not present

## 2021-05-30 ENCOUNTER — Other Ambulatory Visit: Payer: Self-pay | Admitting: Neurosurgery

## 2021-06-02 NOTE — Pre-Procedure Instructions (Signed)
Surgical Instructions    Your procedure is scheduled on Thursday, January 12th.  Report to [facility] Main Entrance "A" at 10:30 A.M., then check in with the Admitting office.  Call this number if you have problems the morning of surgery:  [phone]   If you have any questions prior to your surgery date call [phone]: Open Monday-Friday 8am-4pm    Remember:  Do not eat or drink after midnight the night before your surgery   Take these medicines the morning of surgery with A SIP OF WATER  cefadroxil (DURICEF) 500  pantoprazole (PROTONIX)  tamsulosin (FLOMAX)-if you are due to take on the day of surgery.  As of today, STOP taking any Aspirin (unless otherwise instructed by your surgeon) Aleve, Naproxen, Ibuprofen, Motrin, Advil, Goody's, BC's, all herbal medications, fish oil, and all vitamins.                     Do NOT Smoke (Tobacco/Vaping) or drink Alcohol 24 hours prior to your procedure.  If you use a CPAP at night, you may bring all equipment for your overnight stay.   Contacts, glasses, piercing's, hearing aid's, dentures or partials may not be worn into surgery, please bring cases for these belongings.    For patients admitted to the hospital, discharge time will be determined by your treatment team.   Patients discharged the day of surgery will not be allowed to drive home, and someone needs to stay with them for 24 hours.  NO VISITORS WILL BE ALLOWED IN PRE-OP WHERE PATIENTS GET READY FOR SURGERY.  ONLY 1 SUPPORT PERSON MAY BE PRESENT IN THE WAITING ROOM WHILE YOU ARE IN SURGERY.  IF YOU ARE TO BE ADMITTED, ONCE YOU ARE IN YOUR ROOM YOU WILL BE ALLOWED TWO (2) VISITORS.  Minor children may have two parents present. Special consideration for safety and communication needs will be reviewed on a case by case basis.   Special instructions:   [place]- Preparing For Surgery  Before surgery, you can play an important role. Because skin is not sterile, your skin  needs to be as free of germs as possible. You can reduce the number of germs on your skin by washing with CHG (chlorahexidine gluconate) Soap before surgery.  CHG is an antiseptic cleaner which kills germs and bonds with the skin to continue killing germs even after washing.    Oral Hygiene is also important to reduce your risk of infection.  Remember - BRUSH YOUR TEETH THE MORNING OF SURGERY WITH YOUR REGULAR TOOTHPASTE  Please do not use if you have an allergy to CHG or antibacterial soaps. If your skin becomes reddened/irritated stop using the CHG.  Do not shave (including legs and underarms) for at least 48 hours prior to first CHG shower. It is OK to shave your face.  Please follow these instructions carefully.   Shower the NIGHT BEFORE SURGERY and the MORNING OF SURGERY  If you chose to wash your hair, wash your hair first as usual with your normal shampoo.  After you shampoo, rinse your hair and body thoroughly to remove the shampoo.  Use CHG Soap as you would any other liquid soap. You can apply CHG directly to the skin and wash gently with a scrungie or a clean washcloth.   Apply the CHG Soap to your body ONLY FROM THE NECK DOWN.  Do not use on open wounds or open sores. Avoid contact with your eyes, ears, mouth and genitals (private parts).  Wash Face and genitals (private parts)  with your normal soap.   Wash thoroughly, paying special attention to the area where your surgery will be performed.  Thoroughly rinse your body with warm water from the neck down.  DO NOT shower/wash with your normal soap after using and rinsing off the CHG Soap.  Pat yourself dry with a CLEAN TOWEL.  Wear CLEAN PAJAMAS to bed the night before surgery  Place CLEAN SHEETS on your bed the night before your surgery  DO NOT SLEEP WITH PETS.   Day of Surgery: Shower with CHG soap. Do not wear jewelry. Do not wear lotions, powders, colognes, or deodorant. Do not shave 48 hours prior to surgery.   Men may shave face and neck. Do not bring valuables to the hospital. [facility] is not responsible for any belongings or valuables. Wear Clean/Comfortable clothing the morning of surgery Remember to brush your teeth WITH YOUR REGULAR TOOTHPASTE.   Please read over the following fact sheets that you were given.   3 days prior to your procedure or After your COVID test   You are not required to quarantine however you are required to wear a well-fitting mask when you are out and around people not in your household. If your mask becomes wet or soiled, replace with a new one.   Wash your hands often with soap and water for 20 seconds or clean your hands with an alcohol-based hand sanitizer that contains at least 60% alcohol.   Do not share personal items.   Notify your provider:  o if you are in close contact with someone who has COVID  o or if you develop a fever of 100.4 or greater, sneezing, cough, sore throat, shortness of breath or body aches.

## 2021-06-05 ENCOUNTER — Encounter (HOSPITAL_COMMUNITY)
Admission: RE | Admit: 2021-06-05 | Discharge: 2021-06-05 | Disposition: A | Discharge status: Home / Self Care | Payer: Medicare HMO | Source: Ambulatory Visit | Attending: Neurosurgery | Admitting: Neurosurgery

## 2021-06-05 ENCOUNTER — Encounter (HOSPITAL_COMMUNITY): Payer: Self-pay

## 2021-06-05 ENCOUNTER — Other Ambulatory Visit: Payer: Self-pay

## 2021-06-05 VITALS — BP 163/76 | HR 99 | Temp 98.2°F | Resp 19 | Ht 74.0 in | Wt 238.0 lb

## 2021-06-05 DIAGNOSIS — K269 Duodenal ulcer, unspecified as acute or chronic, without hemorrhage or perforation: Secondary | ICD-10-CM | POA: Diagnosis not present

## 2021-06-05 DIAGNOSIS — E119 Type 2 diabetes mellitus without complications: Secondary | ICD-10-CM | POA: Diagnosis present

## 2021-06-05 DIAGNOSIS — Z01812 Encounter for preprocedural laboratory examination: Secondary | ICD-10-CM | POA: Insufficient documentation

## 2021-06-05 DIAGNOSIS — N138 Other obstructive and reflux uropathy: Secondary | ICD-10-CM | POA: Diagnosis present

## 2021-06-05 DIAGNOSIS — N200 Calculus of kidney: Secondary | ICD-10-CM | POA: Diagnosis not present

## 2021-06-05 DIAGNOSIS — A419 Sepsis, unspecified organism: Secondary | ICD-10-CM | POA: Diagnosis not present

## 2021-06-05 DIAGNOSIS — K769 Liver disease, unspecified: Secondary | ICD-10-CM

## 2021-06-05 DIAGNOSIS — Z20822 Contact with and (suspected) exposure to covid-19: Secondary | ICD-10-CM | POA: Diagnosis present

## 2021-06-05 DIAGNOSIS — Z91013 Allergy to seafood: Secondary | ICD-10-CM | POA: Diagnosis not present

## 2021-06-05 DIAGNOSIS — K279 Peptic ulcer, site unspecified, unspecified as acute or chronic, without hemorrhage or perforation: Secondary | ICD-10-CM | POA: Diagnosis not present

## 2021-06-05 DIAGNOSIS — D649 Anemia, unspecified: Secondary | ICD-10-CM | POA: Diagnosis not present

## 2021-06-05 DIAGNOSIS — K760 Fatty (change of) liver, not elsewhere classified: Secondary | ICD-10-CM | POA: Diagnosis present

## 2021-06-05 DIAGNOSIS — Z01818 Encounter for other preprocedural examination: Secondary | ICD-10-CM

## 2021-06-05 DIAGNOSIS — E668 Other obesity: Secondary | ICD-10-CM | POA: Diagnosis not present

## 2021-06-05 DIAGNOSIS — Z79899 Other long term (current) drug therapy: Secondary | ICD-10-CM | POA: Diagnosis not present

## 2021-06-05 DIAGNOSIS — N401 Enlarged prostate with lower urinary tract symptoms: Secondary | ICD-10-CM | POA: Diagnosis present

## 2021-06-05 DIAGNOSIS — I671 Cerebral aneurysm, nonruptured: Secondary | ICD-10-CM | POA: Diagnosis present

## 2021-06-05 DIAGNOSIS — Z91018 Allergy to other foods: Secondary | ICD-10-CM | POA: Diagnosis not present

## 2021-06-05 DIAGNOSIS — E7849 Other hyperlipidemia: Secondary | ICD-10-CM | POA: Diagnosis not present

## 2021-06-05 DIAGNOSIS — Z87891 Personal history of nicotine dependence: Secondary | ICD-10-CM | POA: Diagnosis not present

## 2021-06-05 DIAGNOSIS — Z8673 Personal history of transient ischemic attack (TIA), and cerebral infarction without residual deficits: Secondary | ICD-10-CM | POA: Diagnosis not present

## 2021-06-05 DIAGNOSIS — E785 Hyperlipidemia, unspecified: Secondary | ICD-10-CM | POA: Diagnosis present

## 2021-06-05 HISTORY — DX: Fatty (change of) liver, not elsewhere classified: K76.0

## 2021-06-05 LAB — CBC
HCT: 34.9 % — ABNORMAL LOW (ref 39.0–52.0)
Hemoglobin: 10.7 g/dL — ABNORMAL LOW (ref 13.0–17.0)
MCH: 25.1 pg — ABNORMAL LOW (ref 26.0–34.0)
MCHC: 30.7 g/dL (ref 30.0–36.0)
MCV: 81.7 fL (ref 80.0–100.0)
Platelets: 228 10*3/uL (ref 150–400)
RBC: 4.27 MIL/uL (ref 4.22–5.81)
RDW: 15.4 % (ref 11.5–15.5)
WBC: 6.7 10*3/uL (ref 4.0–10.5)
nRBC: 0 % (ref 0.0–0.2)

## 2021-06-05 LAB — COMPREHENSIVE METABOLIC PANEL
ALT: 18 U/L (ref 0–44)
AST: 23 U/L (ref 15–41)
Albumin: 3.5 g/dL (ref 3.5–5.0)
Alkaline Phosphatase: 89 U/L (ref 38–126)
Anion gap: 4 — ABNORMAL LOW (ref 5–15)
BUN: 17 mg/dL (ref 8–23)
CO2: 29 mmol/L (ref 22–32)
Calcium: 8.9 mg/dL (ref 8.9–10.3)
Chloride: 105 mmol/L (ref 98–111)
Creatinine, Ser: 0.94 mg/dL (ref 0.61–1.24)
GFR, Estimated: >60 mL/min (ref >60)
Glucose, Bld: 142 mg/dL — ABNORMAL HIGH (ref 70–99)
Potassium: 3.8 mmol/L (ref 3.5–5.1)
Sodium: 138 mmol/L (ref 135–145)
Total Bilirubin: 0.5 mg/dL (ref 0.3–1.2)
Total Protein: 7.6 g/dL (ref 6.5–8.1)

## 2021-06-05 LAB — GLUCOSE, CAPILLARY: Glucose-Capillary: 147 mg/dL — ABNORMAL HIGH (ref 70–99)

## 2021-06-05 NOTE — Progress Notes (Signed)
PCP - Dr. Consuello Masse Cardiologist - denies  PPM/ICD - n/a  Chest x-ray - n/a EKG - 05/05/21 Stress Test - denies ECHO - 05/06/21 Cardiac Cath - denies  Sleep Study - denies CPAP - denies  Pt does not check CBG at home nor has the materials to do so. CBG at PAT 147. Last A1C on 05/08/21 was 5.1.  Blood Thinner Instructions: n/a Aspirin Instructions:n/a  NPO  COVID TEST- 06/05/21 done in PAT  Anesthesia review: No  Patient denies shortness of breath, fever, cough and chest pain at PAT appointment   All instructions explained to the patient, with a verbal understanding of the material. Patient agrees to go over the instructions while at home for a better understanding. Patient also instructed to self quarantine after being tested for COVID-19. The opportunity to ask questions was provided.

## 2021-06-06 LAB — SARS CORONAVIRUS 2 (TAT 6-24 HRS): SARS Coronavirus 2: NEGATIVE

## 2021-06-08 ENCOUNTER — Encounter (HOSPITAL_COMMUNITY): Payer: Self-pay | Admitting: Neurosurgery

## 2021-06-08 ENCOUNTER — Inpatient Hospital Stay (HOSPITAL_COMMUNITY)
Admission: RE | Admit: 2021-06-08 | Discharge: 2021-06-10 | DRG: 026 | Disposition: A | Discharge status: Home / Self Care | Payer: Medicare HMO | Attending: Neurosurgery | Admitting: Neurosurgery

## 2021-06-08 ENCOUNTER — Inpatient Hospital Stay (HOSPITAL_COMMUNITY): Payer: Medicare HMO | Admitting: Anesthesiology

## 2021-06-08 ENCOUNTER — Inpatient Hospital Stay (HOSPITAL_COMMUNITY)
Admission: RE | Disposition: A | Discharge status: Home / Self Care | Payer: Self-pay | Source: Home / Self Care | Attending: Neurosurgery

## 2021-06-08 DIAGNOSIS — E119 Type 2 diabetes mellitus without complications: Secondary | ICD-10-CM | POA: Diagnosis present

## 2021-06-08 DIAGNOSIS — N138 Other obstructive and reflux uropathy: Secondary | ICD-10-CM | POA: Diagnosis present

## 2021-06-08 DIAGNOSIS — K760 Fatty (change of) liver, not elsewhere classified: Secondary | ICD-10-CM | POA: Diagnosis present

## 2021-06-08 DIAGNOSIS — N401 Enlarged prostate with lower urinary tract symptoms: Secondary | ICD-10-CM | POA: Diagnosis not present

## 2021-06-08 DIAGNOSIS — Z87891 Personal history of nicotine dependence: Secondary | ICD-10-CM

## 2021-06-08 DIAGNOSIS — E785 Hyperlipidemia, unspecified: Secondary | ICD-10-CM | POA: Diagnosis present

## 2021-06-08 DIAGNOSIS — Z91018 Allergy to other foods: Secondary | ICD-10-CM | POA: Diagnosis not present

## 2021-06-08 DIAGNOSIS — Z91013 Allergy to seafood: Secondary | ICD-10-CM | POA: Diagnosis not present

## 2021-06-08 DIAGNOSIS — Z79899 Other long term (current) drug therapy: Secondary | ICD-10-CM

## 2021-06-08 DIAGNOSIS — Z8673 Personal history of transient ischemic attack (TIA), and cerebral infarction without residual deficits: Secondary | ICD-10-CM

## 2021-06-08 DIAGNOSIS — D649 Anemia, unspecified: Secondary | ICD-10-CM | POA: Diagnosis not present

## 2021-06-08 DIAGNOSIS — I671 Cerebral aneurysm, nonruptured: Principal | ICD-10-CM | POA: Diagnosis present

## 2021-06-08 DIAGNOSIS — A419 Sepsis, unspecified organism: Secondary | ICD-10-CM | POA: Diagnosis not present

## 2021-06-08 DIAGNOSIS — Z20822 Contact with and (suspected) exposure to covid-19: Secondary | ICD-10-CM | POA: Diagnosis present

## 2021-06-08 DIAGNOSIS — K269 Duodenal ulcer, unspecified as acute or chronic, without hemorrhage or perforation: Secondary | ICD-10-CM | POA: Diagnosis not present

## 2021-06-08 DIAGNOSIS — E7849 Other hyperlipidemia: Secondary | ICD-10-CM | POA: Diagnosis not present

## 2021-06-08 DIAGNOSIS — N200 Calculus of kidney: Secondary | ICD-10-CM | POA: Diagnosis not present

## 2021-06-08 DIAGNOSIS — E668 Other obesity: Secondary | ICD-10-CM | POA: Diagnosis not present

## 2021-06-08 HISTORY — PX: CRANIOTOMY: SHX93

## 2021-06-08 LAB — GLUCOSE, CAPILLARY
Glucose-Capillary: 104 mg/dL — ABNORMAL HIGH (ref 70–99)
Glucose-Capillary: 136 mg/dL — ABNORMAL HIGH (ref 70–99)

## 2021-06-08 LAB — MRSA NEXT GEN BY PCR, NASAL: MRSA by PCR Next Gen: NOT DETECTED

## 2021-06-08 LAB — PREPARE RBC (CROSSMATCH)

## 2021-06-08 SURGERY — CRANIOTOMY INTRACRANIAL ANEURYSM FOR CAROTID
Anesthesia: General | Laterality: Right

## 2021-06-08 MED ORDER — THROMBIN 20000 UNITS EX SOLR
CUTANEOUS | Status: AC
  Filled 2021-06-08: qty 20000

## 2021-06-08 MED ORDER — THROMBIN 20000 UNITS EX SOLR: CUTANEOUS | Status: DC | PRN

## 2021-06-08 MED ORDER — ACETAMINOPHEN 325 MG PO TABS
650.0000 mg | ORAL_TABLET | ORAL | Status: DC | PRN
  Administered 2021-06-09 – 2021-06-10 (×3): 650 mg via ORAL
  Filled 2021-06-08 (×3): qty 2

## 2021-06-08 MED ORDER — LIDOCAINE HCL (PF) 1 % IJ SOLN
INTRAMUSCULAR | Status: DC | PRN
  Administered 2021-06-08: 4 mL

## 2021-06-08 MED ORDER — SODIUM CHLORIDE 0.9 % IV SOLN
0.1500 ug/kg/min | INTRAVENOUS | Status: AC
  Administered 2021-06-08: .15 ug/kg/min via INTRAVENOUS
  Filled 2021-06-08: qty 5000

## 2021-06-08 MED ORDER — 0.9 % SODIUM CHLORIDE (POUR BTL) OPTIME
TOPICAL | Status: DC | PRN
  Administered 2021-06-08: 3000 mL

## 2021-06-08 MED ORDER — LIDOCAINE-EPINEPHRINE 1 %-1:100000 IJ SOLN
INTRAMUSCULAR | Status: AC
  Filled 2021-06-08: qty 1

## 2021-06-08 MED ORDER — CHLORHEXIDINE GLUCONATE CLOTH 2 % EX PADS: 6.0000 | MEDICATED_PAD | Freq: Once | CUTANEOUS | Status: DC

## 2021-06-08 MED ORDER — ACETAMINOPHEN 10 MG/ML IV SOLN: 1000.0000 mg | Freq: Once | INTRAVENOUS | Status: DC | PRN

## 2021-06-08 MED ORDER — ACETAMINOPHEN 650 MG RE SUPP: 650.0000 mg | RECTAL | Status: DC | PRN

## 2021-06-08 MED ORDER — LABETALOL HCL 5 MG/ML IV SOLN
10.0000 mg | INTRAVENOUS | Status: DC | PRN
  Administered 2021-06-08 (×3): 10 mg via INTRAVENOUS
  Filled 2021-06-08 (×3): qty 8

## 2021-06-08 MED ORDER — PROPOFOL 10 MG/ML IV BOLUS
INTRAVENOUS | Status: DC | PRN
Start: 2021-06-08 — End: 2021-06-08
  Administered 2021-06-08: 50 mg via INTRAVENOUS
  Administered 2021-06-08: 150 mg via INTRAVENOUS
  Administered 2021-06-08: 20 mg via INTRAVENOUS

## 2021-06-08 MED ORDER — LEVETIRACETAM IN NACL 500 MG/100ML IV SOLN
500.0000 mg | Freq: Two times a day (BID) | INTRAVENOUS | Status: DC
  Administered 2021-06-08 – 2021-06-09 (×2): 500 mg via INTRAVENOUS
  Filled 2021-06-08 (×2): qty 100

## 2021-06-08 MED ORDER — LIDOCAINE HCL (PF) 1 % IJ SOLN
INTRAMUSCULAR | Status: AC
  Filled 2021-06-08: qty 30

## 2021-06-08 MED ORDER — GLYCOPYRROLATE PF 0.2 MG/ML IJ SOSY
PREFILLED_SYRINGE | INTRAMUSCULAR | Status: DC | PRN
Start: 2021-06-08 — End: 2021-06-08
  Administered 2021-06-08 (×2): .1 mg via INTRAVENOUS

## 2021-06-08 MED ORDER — THROMBIN 5000 UNITS EX SOLR
CUTANEOUS | Status: AC
  Filled 2021-06-08: qty 5000

## 2021-06-08 MED ORDER — SODIUM CHLORIDE 0.9 % IV SOLN
0.1500 ug/kg/min | INTRAVENOUS | Status: DC
  Filled 2021-06-08: qty 5000

## 2021-06-08 MED ORDER — ONDANSETRON HCL 4 MG/2ML IJ SOLN
INTRAMUSCULAR | Status: DC | PRN
Start: 2021-06-08 — End: 2021-06-08
  Administered 2021-06-08: 4 mg via INTRAVENOUS

## 2021-06-08 MED ORDER — ONDANSETRON HCL 4 MG/2ML IJ SOLN: 4.0000 mg | INTRAMUSCULAR | Status: DC | PRN

## 2021-06-08 MED ORDER — SODIUM CHLORIDE 0.9 % IV SOLN: INTRAVENOUS | Status: DC

## 2021-06-08 MED ORDER — CHLORHEXIDINE GLUCONATE 0.12 % MT SOLN: 15.0000 mL | Freq: Once | OROMUCOSAL | Status: AC

## 2021-06-08 MED ORDER — BUPIVACAINE HCL 0.5 % IJ SOLN
INTRAMUSCULAR | Status: DC | PRN
  Administered 2021-06-08: 4 mL

## 2021-06-08 MED ORDER — LEVETIRACETAM IN NACL 1000 MG/100ML IV SOLN
1000.0000 mg | INTRAVENOUS | Status: AC
  Administered 2021-06-08: 1000 mg via INTRAVENOUS
  Filled 2021-06-08: qty 100

## 2021-06-08 MED ORDER — LABETALOL HCL 5 MG/ML IV SOLN
INTRAVENOUS | Status: AC
  Filled 2021-06-08: qty 4

## 2021-06-08 MED ORDER — HYDROCODONE-ACETAMINOPHEN 5-325 MG PO TABS
1.0000 | ORAL_TABLET | ORAL | Status: DC | PRN
  Administered 2021-06-09 – 2021-06-10 (×2): 1 via ORAL
  Filled 2021-06-08 (×2): qty 1

## 2021-06-08 MED ORDER — ONDANSETRON HCL 4 MG PO TABS: 4.0000 mg | ORAL_TABLET | ORAL | Status: DC | PRN

## 2021-06-08 MED ORDER — MICROFIBRILLAR COLL HEMOSTAT EX POWD
CUTANEOUS | Status: AC
  Filled 2021-06-08: qty 5

## 2021-06-08 MED ORDER — SODIUM CHLORIDE 0.9 % IV SOLN
INTRAVENOUS | Status: DC | PRN
Start: 2021-06-08 — End: 2021-06-08

## 2021-06-08 MED ORDER — ROCURONIUM BROMIDE 10 MG/ML (PF) SYRINGE
PREFILLED_SYRINGE | INTRAVENOUS | Status: DC | PRN
  Administered 2021-06-08 (×2): 30 mg via INTRAVENOUS
  Administered 2021-06-08: 70 mg via INTRAVENOUS

## 2021-06-08 MED ORDER — HEMOSTATIC AGENTS (NO CHARGE) OPTIME
TOPICAL | Status: DC | PRN
  Administered 2021-06-08: 1 via TOPICAL

## 2021-06-08 MED ORDER — PANTOPRAZOLE SODIUM 20 MG PO TBEC
20.0000 mg | DELAYED_RELEASE_TABLET | Freq: Every day | ORAL | Status: DC
  Administered 2021-06-09 – 2021-06-10 (×2): 20 mg via ORAL
  Filled 2021-06-08 (×2): qty 1

## 2021-06-08 MED ORDER — FENTANYL CITRATE (PF) 100 MCG/2ML IJ SOLN: 25.0000 ug | INTRAMUSCULAR | Status: DC | PRN

## 2021-06-08 MED ORDER — MORPHINE SULFATE (PF) 2 MG/ML IV SOLN: 1.0000 mg | INTRAVENOUS | Status: DC | PRN

## 2021-06-08 MED ORDER — INDOCYANINE GREEN 25 MG IV SOLR
INTRAVENOUS | Status: DC | PRN
  Administered 2021-06-08 (×2): 12.5 mg via INTRAVENOUS

## 2021-06-08 MED ORDER — ORAL CARE MOUTH RINSE: 15.0000 mL | Freq: Once | OROMUCOSAL | Status: AC

## 2021-06-08 MED ORDER — CEFADROXIL 500 MG PO CAPS: 500.0000 mg | ORAL_CAPSULE | Freq: Two times a day (BID) | ORAL | Status: DC

## 2021-06-08 MED ORDER — CEFAZOLIN SODIUM-DEXTROSE 2-4 GM/100ML-% IV SOLN
2.0000 g | INTRAVENOUS | Status: AC
  Administered 2021-06-08: 2 g via INTRAVENOUS

## 2021-06-08 MED ORDER — NYSTATIN 100000 UNIT/GM EX OINT
1.0000 "application " | TOPICAL_OINTMENT | Freq: Two times a day (BID) | CUTANEOUS | Status: DC
  Administered 2021-06-08 – 2021-06-09 (×2): 1 via TOPICAL
  Filled 2021-06-08 (×2): qty 15

## 2021-06-08 MED ORDER — BISACODYL 10 MG RE SUPP: 10.0000 mg | Freq: Every day | RECTAL | Status: DC | PRN

## 2021-06-08 MED ORDER — BUPIVACAINE HCL (PF) 0.5 % IJ SOLN
INTRAMUSCULAR | Status: AC
  Filled 2021-06-08: qty 30

## 2021-06-08 MED ORDER — DOCUSATE SODIUM 100 MG PO CAPS
300.0000 mg | ORAL_CAPSULE | Freq: Every day | ORAL | Status: DC
Start: 2021-06-08 — End: 2021-06-10
  Administered 2021-06-08 – 2021-06-09 (×2): 300 mg via ORAL
  Filled 2021-06-08 (×2): qty 3

## 2021-06-08 MED ORDER — FENTANYL CITRATE (PF) 250 MCG/5ML IJ SOLN
INTRAMUSCULAR | Status: AC
  Filled 2021-06-08: qty 5

## 2021-06-08 MED ORDER — PHENYLEPHRINE HCL-NACL 20-0.9 MG/250ML-% IV SOLN
INTRAVENOUS | Status: DC | PRN
  Administered 2021-06-08: 20 ug/min via INTRAVENOUS

## 2021-06-08 MED ORDER — ADULT MULTIVITAMIN W/MINERALS CH
1.0000 | ORAL_TABLET | Freq: Every day | ORAL | Status: DC
  Administered 2021-06-08 – 2021-06-10 (×3): 1 via ORAL
  Filled 2021-06-08 (×3): qty 1

## 2021-06-08 MED ORDER — LIDOCAINE 2% (20 MG/ML) 5 ML SYRINGE
INTRAMUSCULAR | Status: DC | PRN
Start: 2021-06-08 — End: 2021-06-08
  Administered 2021-06-08: 80 mg via INTRAVENOUS

## 2021-06-08 MED ORDER — LACTATED RINGERS IV SOLN: INTRAVENOUS | Status: DC

## 2021-06-08 MED ORDER — FENTANYL CITRATE (PF) 250 MCG/5ML IJ SOLN
INTRAMUSCULAR | Status: DC | PRN
  Administered 2021-06-08: 150 ug via INTRAVENOUS

## 2021-06-08 MED ORDER — SUGAMMADEX SODIUM 200 MG/2ML IV SOLN
INTRAVENOUS | Status: DC | PRN
  Administered 2021-06-08: 400 mg via INTRAVENOUS

## 2021-06-08 MED ORDER — DEXAMETHASONE SODIUM PHOSPHATE 10 MG/ML IJ SOLN
INTRAMUSCULAR | Status: DC | PRN
  Administered 2021-06-08: 10 mg via INTRAVENOUS

## 2021-06-08 MED ORDER — CEFAZOLIN SODIUM-DEXTROSE 2-4 GM/100ML-% IV SOLN
INTRAVENOUS | Status: AC
  Filled 2021-06-08: qty 100

## 2021-06-08 MED ORDER — TAMSULOSIN HCL 0.4 MG PO CAPS
0.4000 mg | ORAL_CAPSULE | ORAL | Status: DC
  Administered 2021-06-10: 0.4 mg via ORAL
  Filled 2021-06-08: qty 1

## 2021-06-08 MED ORDER — THROMBIN 5000 UNITS EX SOLR: OROMUCOSAL | Status: DC | PRN

## 2021-06-08 MED ORDER — BACITRACIN ZINC 500 UNIT/GM EX OINT
TOPICAL_OINTMENT | CUTANEOUS | Status: DC | PRN
  Administered 2021-06-08: 1 via TOPICAL

## 2021-06-08 MED ORDER — CHLORHEXIDINE GLUCONATE 0.12 % MT SOLN
OROMUCOSAL | Status: AC
  Administered 2021-06-08: 15 mL via OROMUCOSAL
  Filled 2021-06-08: qty 15

## 2021-06-08 MED ORDER — BACITRACIN ZINC 500 UNIT/GM EX OINT
TOPICAL_OINTMENT | CUTANEOUS | Status: AC
  Filled 2021-06-08: qty 28.35

## 2021-06-08 MED ORDER — PROMETHAZINE HCL 25 MG PO TABS: 12.5000 mg | ORAL_TABLET | ORAL | Status: DC | PRN

## 2021-06-08 MED ORDER — TRIAMCINOLONE ACETONIDE 0.1 % EX CREA
1.0000 "application " | TOPICAL_CREAM | Freq: Every day | CUTANEOUS | Status: DC
  Administered 2021-06-08: 1 via TOPICAL
  Filled 2021-06-08: qty 15

## 2021-06-08 SURGICAL SUPPLY — 69 items
BAG COUNTER SPONGE SURGICOUNT (BAG) ×2 IMPLANT
BAND RUBBER #18 3X1/16 STRL (MISCELLANEOUS) ×4 IMPLANT
BNDG GAUZE ELAST 4 BULKY (GAUZE/BANDAGES/DRESSINGS) ×2 IMPLANT
BNDG STRETCH 4X75 NS LF (GAUZE/BANDAGES/DRESSINGS) ×1 IMPLANT
BUR ROUND FLUTED 4 SOFT TCH (BURR) ×1 IMPLANT
BUR SPIRAL ROUTER 2.3 (BUR) ×1 IMPLANT
CANISTER SUCT 3000ML PPV (MISCELLANEOUS) ×4 IMPLANT
CARTRIDGE OIL MAESTRO DRILL (MISCELLANEOUS) ×1 IMPLANT
CLIP ANEURY TI PERM STD 6.5 (Clip) ×1 IMPLANT
CLIP ANEURY TI PERM STD STR 20 (Clip) ×1 IMPLANT
CLIP ANEURY TI PERM STD STR 9M (Clip) ×1 IMPLANT
DIFFUSER DRILL AIR PNEUMATIC (MISCELLANEOUS) ×2 IMPLANT
DRAPE MICROSCOPE LEICA (MISCELLANEOUS) ×2 IMPLANT
DRAPE NEUROLOGICAL W/INCISE (DRAPES) ×2 IMPLANT
DRAPE WARM FLUID 44X44 (DRAPES) ×2 IMPLANT
DRSG ADAPTIC 3X8 NADH LF (GAUZE/BANDAGES/DRESSINGS) ×2 IMPLANT
DRSG TELFA 3X8 NADH (GAUZE/BANDAGES/DRESSINGS) ×2 IMPLANT
DURAPREP 6ML APPLICATOR 50/CS (WOUND CARE) ×2 IMPLANT
ELECT REM PT RETURN 9FT ADLT (ELECTROSURGICAL) ×2
ELECTRODE REM PT RTRN 9FT ADLT (ELECTROSURGICAL) ×1 IMPLANT
EVACUATOR SILICONE 100CC (DRAIN) IMPLANT
FORCEPS BIPOLAR SPETZLER 8 1.0 (NEUROSURGERY SUPPLIES) ×1 IMPLANT
GAUZE 4X4 16PLY ~~LOC~~+RFID DBL (SPONGE) ×3 IMPLANT
GLOVE SURG ENC MOIS LTX SZ7.5 (GLOVE) ×2 IMPLANT
GLOVE SURG LTX SZ7 (GLOVE) ×4 IMPLANT
GLOVE SURG POLYISO LF SZ7 (GLOVE) ×8 IMPLANT
GLOVE SURG POLYISO LF SZ7.5 (GLOVE) ×1 IMPLANT
GLOVE SURG UNDER POLY LF SZ7.5 (GLOVE) ×10 IMPLANT
GOWN STRL REUS W/ TWL LRG LVL3 (GOWN DISPOSABLE) ×2 IMPLANT
GOWN STRL REUS W/ TWL XL LVL3 (GOWN DISPOSABLE) IMPLANT
GOWN STRL REUS W/TWL 2XL LVL3 (GOWN DISPOSABLE) IMPLANT
GOWN STRL REUS W/TWL LRG LVL3 (GOWN DISPOSABLE) ×3
GOWN STRL REUS W/TWL XL LVL3 (GOWN DISPOSABLE) ×2
GRAFT DURAGEN MATRIX 5WX7L (Graft) ×1 IMPLANT
HEMOSTAT POWDER KIT SURGIFOAM (HEMOSTASIS) ×1 IMPLANT
HEMOSTAT SURGICEL 2X14 (HEMOSTASIS) ×2 IMPLANT
HOOK DURA (MISCELLANEOUS) ×2 IMPLANT
HOOK DURA 1/2IN (MISCELLANEOUS) ×2 IMPLANT
KIT BASIN OR (CUSTOM PROCEDURE TRAY) ×2 IMPLANT
KIT DRAIN CSF ACCUDRAIN (MISCELLANEOUS) IMPLANT
KIT TURNOVER KIT B (KITS) ×2 IMPLANT
KNIFE ARACHNOID DISP AM-24-S (MISCELLANEOUS) ×1 IMPLANT
NDL HYPO 25X1 1.5 SAFETY (NEEDLE) ×1 IMPLANT
NDL SPNL 25GX3.5 QUINCKE BL (NEEDLE) IMPLANT
NEEDLE HYPO 25X1 1.5 SAFETY (NEEDLE) ×2 IMPLANT
NEEDLE SPNL 25GX3.5 QUINCKE BL (NEEDLE) IMPLANT
NS IRRIG 1000ML POUR BTL (IV SOLUTION) ×4 IMPLANT
OIL CARTRIDGE MAESTRO DRILL (MISCELLANEOUS) ×2
PACK BATTERY CMF DISP FOR DVR (ORTHOPEDIC DISPOSABLE SUPPLIES) ×1 IMPLANT
PACK CRANIOTOMY CUSTOM (CUSTOM PROCEDURE TRAY) ×2 IMPLANT
PAD DRESSING TELFA 3X8 NADH (GAUZE/BANDAGES/DRESSINGS) ×1 IMPLANT
PIN MAYFIELD SKULL DISP (PIN) ×1 IMPLANT
PLATE CRANIAL 4H UNI NEURO III (Plate) ×1 IMPLANT
PLATE UNIV CMF 16 2H (Plate) ×1 IMPLANT
SCREW UNIII AXS SD 1.5X4 (Screw) ×6 IMPLANT
SPONGE NEURO XRAY DETECT 1X3 (DISPOSABLE) IMPLANT
SPONGE SURGIFOAM ABS GEL 100 (HEMOSTASIS) ×3 IMPLANT
STAPLER VISISTAT 35W (STAPLE) ×2 IMPLANT
STOCKINETTE 6  STRL (DRAPES) ×1
STOCKINETTE 6 STRL (DRAPES) ×1 IMPLANT
SUT ETHILON 3 0 FSL (SUTURE) IMPLANT
SUT NURALON 4 0 TR CR/8 (SUTURE) ×4 IMPLANT
SUT VIC AB 0 CT1 18XCR BRD8 (SUTURE) ×2 IMPLANT
SUT VIC AB 0 CT1 8-18 (SUTURE) ×2
SUT VIC AB 3-0 SH 8-18 (SUTURE) ×2 IMPLANT
TOWEL GREEN STERILE (TOWEL DISPOSABLE) ×2 IMPLANT
TOWEL GREEN STERILE FF (TOWEL DISPOSABLE) ×2 IMPLANT
TRAY FOLEY MTR SLVR 16FR STAT (SET/KITS/TRAYS/PACK) ×1 IMPLANT
WATER STERILE IRR 1000ML POUR (IV SOLUTION) ×2 IMPLANT
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
## 2021-06-08 NOTE — Anesthesia Procedure Notes (Addendum)
Arterial Line Insertion Start/End1/04/2022 11:45 AM, 06/08/2021 11:50 AM Performed by: Wilburn Cornelia, CRNA, CRNA  Patient location: Pre-op. Preanesthetic checklist: patient identified, IV checked, site marked, risks and benefits discussed, surgical consent, monitors and equipment checked, pre-op evaluation, timeout performed and anesthesia consent Lidocaine 1% used for infiltration Left, radial was placed Catheter size: 20 G Hand hygiene performed  and maximum sterile barriers used  Allen's test indicative of satisfactory collateral circulation Attempts: 1 Procedure performed without using ultrasound guided technique. Following insertion, Biopatch and dressing applied. Post procedure assessment: normal

## 2021-06-08 NOTE — Op Note (Signed)
NEUROSURGERY OPERATIVE NOTE   PREOP DIAGNOSIS:  Right MCA aneurysm   POSTOP DIAGNOSIS: Same  PROCEDURE: Right pterional craniotomy for clipping of MCA aneurysm, simple Intraoperative ICG videoangiography  SURGEON: Dr. Consuella Lose, MD  ASSISTANT: Dr. Duffy Rhody, MD  ANESTHESIA: General Endotracheal  EBL: 100cc  SPECIMENS: None  DRAINS: None  COMPLICATIONS: None immediate  CONDITION: Hemodynamically stable to PACU  HISTORY: William Leonard is a 76 y.o. male initially seen in the outpatient neurosurgery clinic with incidental discovery of a right middle cerebral artery aneurysm.  He further underwent diagnostic cerebral angiogram confirming the presence of an MCA aneurysm on the right side incorporating the origin of the M2 vessels.  He was not felt to be a good candidate for primary endovascular treatment.  After lengthy discussion of treatment options in the office including continued expectant observation, the patient elected proceed with surgical clipping.  The risks, benefits, and alternatives to surgery were all reviewed in detail with the patient and his family.  After all her questions were answered informed consent was obtained and witnessed.  PROCEDURE IN DETAIL: The patient was brought to the operating room. After induction of general anesthesia, the patient was positioned on the operative table in the [place] head holder in the supine. All pressure points were meticulously padded.  Standard right sided frontotemporal curvilinear skin incision was then marked out and prepped and draped in the usual sterile fashion.  After timeout was conducted, the incision was infiltrated with local anesthetic with epinephrine.  Incision was then made sharply and carried down through the galea.  Raney clips were applied.  The Bovie was used to incise the temporalis fascia and the underlying muscle.  A single piece myocutaneous flap was then elevated and reflected anteriorly.   High-speed drill was used to create multiple bur holes and a standard frontotemporal craniotomy flap was elevated.  Dura was noted to be significantly adherent to the undersurface of the bone.  Hemostasis on the epidural plane was secured with bipolar electrocautery and morselized Gelfoam with thrombin.  The high-speed drill was then used to drill down the lesser wing of the sphenoid in order to allow unobstructed view to the optical carotid cistern.  The dura was then opened in curvilinear fashion and tacked up with 4 Nurolon stitches.  The microscope was then draped sterilely and the remainder of the aneurysm clipping was done under the microscope using microdissection technique.  Initially a subfrontal approach was employed to identify the right optic nerve.  The arachnoid overlying the optic nerve was then incised with a arachnoid knife.  The arachnoid dissection was carried both medially and laterally in order to open up the optical carotid cistern.  There was good egress of CSF which provided good brain relaxation.  The internal carotid artery was identified.  The proximal portion of the sylvian fissure was also dissected out to identify the M1 segment.  I then turned attention to the more distal portion of the sylvian fissure which was again dissected using a combination of arachnoid knife and blunt dissection.  I was then easily able to identify the middle cerebral artery bifurcation and the anteriorly projecting aneurysm.  The dome of the aneurysm was adherent to the mesial temporal lobe.  I was able to identify the origin of the M2 segments.  A curved titanium clip was initially selected and placed across the neck of the aneurysm taking care to preserve the origin of the M2 segments.  I then further dissected the dome  of the aneurysm away from the temporal lobe.  There was an on passage vessel which was preserved.  Once the dome of the aneurysm was dissected away from the temporal lobe I was able to  mobilize the MCA bifurcation complex.  It appeared that the aneurysm clip was not fully covering the neck of the aneurysm.  This aneurysm clip was therefore removed and a 7 mm straight standard titanium clip was placed across the neck of the aneurysm.  This did appear to fully cross the aneurysm.  Intraoperative indocyanine green video angiography was then performed.  While there did appear to be good occlusion of the aneurysm dome, there is a small portion of aneurysm just inferior to the clip at the MCA bifurcation which appeared to continue to fill.  I further dissected small MCA branches away from the aneurysm dome and neck.  I then selected a small angled fenestrated clip and use this clip to occlude the small dogear with the initial clip placed within the fenestration of the second.  Repeat ICG video angiography was completed and confirmed patency of the M1 and M2 segments and no filling of the aneurysm.  At this point the wound was irrigated with normal saline.  There is no active bleeding identified.  The dura was reapproximated with interrupted 4-0 Nurolon stitches.  A DuraGen onlay graft was placed.  Bone flap was then replaced and plated with standard titanium plates and screws.  Wound was again irrigated with normal saline.  The temporalis muscle and fascia were reapproximated with interrupted 0 Vicryl stitches.  The galea was approximated with interrupted 3-0 Vicryl stitches and the skin was closed with staples.  At the end of the case all sponge, needle, instrument, and cottonoid counts were correct.  Patient was then removed from the [place] head holder and turban dressing was applied.  He was then transferred to the stretcher, extubated, and taken to the postanesthesia care unit in stable hemodynamic condition.   Consuella Lose, MD [facility]

## 2021-06-08 NOTE — Anesthesia Preprocedure Evaluation (Addendum)
Anesthesia Evaluation  Patient identified by MRN, date of birth, ID band Patient awake    Reviewed: Allergy & Precautions, NPO status , Patient's Chart, lab work & pertinent test results  Airway Mallampati: III  TM Distance: >3 FB Neck ROM: Full    Dental  (+) Poor Dentition   Pulmonary neg pulmonary ROS, former smoker,    Pulmonary exam normal        Cardiovascular negative cardio ROS   Rhythm:Regular Rate:Normal     Neuro/Psych MCA aneurysm CVA (09/22) negative psych ROS   GI/Hepatic Neg liver ROS, PUD, GERD  Medicated,  Endo/Other  diabetes  Renal/GU negative Renal ROS  negative genitourinary   Musculoskeletal negative musculoskeletal ROS (+)   Abdominal Normal abdominal exam  (+)   Peds  Hematology  (+) anemia ,   Anesthesia Other Findings   Reproductive/Obstetrics                            Anesthesia Physical Anesthesia Plan  ASA: 3  Anesthesia Plan: General   Post-op Pain Management:    Induction: Intravenous  PONV Risk Score and Plan: 2 and Ondansetron, Dexamethasone and Treatment may vary due to age or medical condition  Airway Management Planned: Mask and Oral ETT  Additional Equipment: Arterial line  Intra-op Plan:   Post-operative Plan: Extubation in OR  Informed Consent: I have reviewed the patients History and Physical, chart, labs and discussed the procedure including the risks, benefits and alternatives for the proposed anesthesia with the patient or authorized representative who has indicated his/her understanding and acceptance.     Dental advisory given  Plan Discussed with: CRNA  Anesthesia Plan Comments: (Lab Results      Component                Value               Date                      WBC                      6.7                 06/05/2021                HGB                      10.7 (L)            06/05/2021                HCT                       34.9 (L)            06/05/2021                MCV                      81.7                06/05/2021                PLT                      228  06/05/2021           Lab Results      Component                Value               Date                      NA                       138                 06/05/2021                K                        3.8                 06/05/2021                CO2                      29                  06/05/2021                GLUCOSE                  142 (H)             06/05/2021                BUN                      17                  06/05/2021                CREATININE               0.94                06/05/2021                CALCIUM                  8.9                 06/05/2021                GFRNONAA                 >60                 06/05/2021       ECHO 12/22: 1. Left ventricular ejection fraction, by estimation, is 55 to 60%. The  left ventricle has normal function. The left ventricle has no regional  wall motion abnormalities. There is mild concentric left ventricular  hypertrophy. Left ventricular diastolic  parameters are consistent with Grade I diastolic dysfunction (impaired  relaxation).  2. Right ventricular systolic function is normal. The right ventricular  size is normal.  3. Left atrial size was mildly dilated.  4. Right atrial size was mildly dilated.  5. The mitral valve is degenerative. Mild mitral valve regurgitation.  Moderate mitral annular calcification.  6. The aortic valve is tricuspid. There is severe calcifcation of the  aortic valve. There  is moderate thickening of the aortic valve. Aortic  valve regurgitation is not visualized. Mild aortic valve stenosis.  7. There is mild (Grade II) atheroma plaque involving the aortic root and  ascending aorta.  8. The inferior vena cava is dilated in size with >50% respiratory  variability, suggesting right atrial pressure of 8 mmHg.       )       Anesthesia Quick Evaluation

## 2021-06-08 NOTE — Anesthesia Procedure Notes (Signed)
Procedure Name: Intubation Date/Time: 06/08/2021 12:40 PM Performed by: Wilburn Cornelia, CRNA Pre-anesthesia Checklist: Patient identified, Emergency Drugs available, Suction available, Patient being monitored and Timeout performed Patient Re-evaluated:Patient Re-evaluated prior to induction Oxygen Delivery Method: Circle system utilized Preoxygenation: Pre-oxygenation with 100% oxygen Induction Type: IV induction Ventilation: Mask ventilation without difficulty Grade View: Grade I Tube type: Oral Tube size: 8.0 mm Number of attempts: 1 Airway Equipment and Method: Rigid stylet and Video-laryngoscopy Placement Confirmation: ETT inserted through vocal cords under direct vision, positive ETCO2, CO2 detector and breath sounds checked- equal and bilateral Secured at: 23 cm Tube secured with: Tape Dental Injury: Teeth and Oropharynx as per pre-operative assessment

## 2021-06-08 NOTE — Transfer of Care (Signed)
Immediate Anesthesia Transfer of Care Note  Patient: William Leonard  Procedure(s) Performed: FRONTOTEMPORAL CRANIOTOMY FOR CLIPPING OF MCA ANEURYSM (Right)  Patient Location: PACU  Anesthesia Type:General  Level of Consciousness: awake, alert , drowsy and patient cooperative  Airway & Oxygen Therapy: Patient Spontanous Breathing and Patient connected to face mask oxygen  Post-op Assessment: Report given to RN and Post -op Vital signs reviewed and stable  Post vital signs: Reviewed and stable  Last Vitals:  Vitals Value Taken Time  BP    Temp 36.5 C 06/08/21 1625  Pulse 95 06/08/21 1626  Resp 16 06/08/21 1626  SpO2 100 % 06/08/21 1626  Vitals shown include unvalidated device data.  Last Pain:  Vitals:   06/08/21 1048  TempSrc:   PainSc: 0-No pain      Patients Stated Pain Goal: 0 (16/10/96 0454)  Complications: No notable events documented.

## 2021-06-09 ENCOUNTER — Encounter (HOSPITAL_COMMUNITY): Payer: Self-pay | Admitting: Neurosurgery

## 2021-06-09 LAB — POCT I-STAT 7, (LYTES, BLD GAS, ICA,H+H)
Acid-Base Excess: 5 mmol/L — ABNORMAL HIGH (ref 0.0–2.0)
Acid-Base Excess: 3 mmol/L — ABNORMAL HIGH (ref 0.0–2.0)
Bicarbonate: 30.3 mmol/L — ABNORMAL HIGH (ref 20.0–28.0)
Bicarbonate: 28 mmol/L (ref 20.0–28.0)
Calcium, Ion: 1.23 mmol/L (ref 1.15–1.40)
Calcium, Ion: 1.24 mmol/L (ref 1.15–1.40)
HCT: 28 % — ABNORMAL LOW (ref 39.0–52.0)
HCT: 29 % — ABNORMAL LOW (ref 39.0–52.0)
Hemoglobin: 9.5 g/dL — ABNORMAL LOW (ref 13.0–17.0)
Hemoglobin: 9.9 g/dL — ABNORMAL LOW (ref 13.0–17.0)
O2 Saturation: 100 %
O2 Saturation: 100 %
Patient temperature: 35.5
Potassium: 3.5 mmol/L (ref 3.5–5.1)
Potassium: 3.6 mmol/L (ref 3.5–5.1)
Sodium: 141 mmol/L (ref 135–145)
Sodium: 143 mmol/L (ref 135–145)
TCO2: 32 mmol/L (ref 22–32)
TCO2: 29 mmol/L (ref 22–32)
pCO2 arterial: 45.6 mmHg (ref 32.0–48.0)
pCO2 arterial: 42.2 mmHg (ref 32.0–48.0)
pH, Arterial: 7.423 (ref 7.350–7.450)
pH, Arterial: 7.429 (ref 7.350–7.450)
pO2, Arterial: 505 mmHg — ABNORMAL HIGH (ref 83.0–108.0)
pO2, Arterial: 236 mmHg — ABNORMAL HIGH (ref 83.0–108.0)

## 2021-06-09 LAB — BPAM RBC
Blood Product Expiration Date: 202301252359
Blood Product Expiration Date: 202301312359
Blood Product Expiration Date: 202302032359
Blood Product Expiration Date: 202302032359
ISSUE DATE / TIME: 202301091705
ISSUE DATE / TIME: 202301100927
Unit Type and Rh: 5100
Unit Type and Rh: 5100
Unit Type and Rh: 5100
Unit Type and Rh: 5100

## 2021-06-09 LAB — TYPE AND SCREEN
ABO/RH(D): O POS
Antibody Screen: NEGATIVE
Unit division: 0
Unit division: 0
Unit division: 0
Unit division: 0

## 2021-06-09 MED ORDER — CHLORHEXIDINE GLUCONATE CLOTH 2 % EX PADS
6.0000 | MEDICATED_PAD | Freq: Every day | CUTANEOUS | Status: DC
  Administered 2021-06-09: 6 via TOPICAL

## 2021-06-09 MED ORDER — LEVETIRACETAM 500 MG PO TABS
500.0000 mg | ORAL_TABLET | Freq: Two times a day (BID) | ORAL | Status: DC
  Administered 2021-06-09 – 2021-06-10 (×2): 500 mg via ORAL
  Filled 2021-06-09 (×2): qty 1

## 2021-06-09 MED FILL — Thrombin For Soln 20000 Unit: CUTANEOUS | Qty: 1 | Status: AC

## 2021-06-09 NOTE — Progress Notes (Signed)
In report day shift nurse stated that Dr said it was fine to go by cuff pressure since there is a whip in the A line.

## 2021-06-09 NOTE — Progress Notes (Signed)
°  Transition of Care [facility]) Screening Note   Patient Details  Name: BODI PALMERI Date of Birth: 1945-06-05   Transition of Care [facility] CM/SW Contact:    Benard Halsted, LCSW Phone Number: 06/09/2021, 2:54 PM    Transition of Care Department [facility]) has reviewed patient and no TOC needs have been identified at this time. We will continue to monitor patient advancement through interdisciplinary progression rounds. If new patient transition needs arise, please place a TOC consult.

## 2021-06-09 NOTE — Anesthesia Postprocedure Evaluation (Signed)
Anesthesia Post Note  Patient: William Leonard  Procedure(s) Performed: FRONTOTEMPORAL CRANIOTOMY FOR CLIPPING OF MCA ANEURYSM (Right)     Patient location during evaluation: PACU Anesthesia Type: General Level of consciousness: awake and alert Pain management: pain level controlled Vital Signs Assessment: post-procedure vital signs reviewed and stable Respiratory status: spontaneous breathing, nonlabored ventilation, respiratory function stable and patient connected to nasal cannula oxygen Cardiovascular status: blood pressure returned to baseline and stable Postop Assessment: no apparent nausea or vomiting Anesthetic complications: no   No notable events documented.  Last Vitals:  Vitals:   06/09/21 0800 06/09/21 0900  BP:  105/63  Pulse: 77 65  Resp: 20 16  Temp: 36.8 C   SpO2: 97% 96%    Last Pain:  Vitals:   06/09/21 0852  TempSrc:   PainSc: Asleep                 March Rummage Lysa Livengood

## 2021-06-09 NOTE — Evaluation (Signed)
Physical Therapy Evaluation Patient Details Name: William Leonard MRN: [id number] DOB: 1945/12/13 Today's Date: 06/09/2021  History of Present Illness  76 y/o male admitted following R MCA aneurysm clipping on 06/08/21. PMH: CVA, diabetes, R MCA aneurysm  Clinical Impression  Patient admitted for above diagnosis. Patient presents with generalized weakness (L>R), impaired balance, and decreased activity tolerance. Patient requires min guard for ambulation and stair negotiation with rails. Patient with mild L knee buckling with mobility. Patient states his gait pattern is baseline. Patient will benefit from skilled PT services during acute stay to address listed deficits. Recommend OPPT to address balance and strength deficits.      Recommendations for follow up therapy are one component of a multi-disciplinary discharge planning process, led by the attending physician.  Recommendations may be updated based on patient status, additional functional criteria and insurance authorization.  Follow Up Recommendations Outpatient PT    Assistance Recommended at Discharge Intermittent Supervision/Assistance  Patient can return home with the following  A little help with walking and/or transfers    Equipment Recommendations None recommended by PT  Recommendations for Other Services       Functional Status Assessment Patient has had a recent decline in their functional status and demonstrates the ability to make significant improvements in function in a reasonable and predictable amount of time.     Precautions / Restrictions Precautions Precautions: Fall Restrictions Weight Bearing Restrictions: No      Mobility  Bed Mobility               General bed mobility comments: in recliner on arrival    Transfers Overall transfer level: Needs assistance Equipment used: None Transfers: Sit to/from Stand Sit to Stand: Supervision                Ambulation/Gait Ambulation/Gait  assistance: Min guard Gait Distance (Feet): 250 Feet Assistive device: None Gait Pattern/deviations: Step-through pattern;Decreased stride length;Decreased stance time - left;Decreased stance time - right Gait velocity: decreased     General Gait Details: "waddling" gait pattern but patient states this is baseline. Patient with L knee instability and one instance of mild buckling due to weakness. min guard for safety  Stairs Stairs: Yes Stairs assistance: Min guard Stair Management: One rail Right;Step to pattern;Forwards Number of Stairs: 2    Wheelchair Mobility    Modified Rankin (Stroke Patients Only)       Balance Overall balance assessment: Mild deficits observed, not formally tested                                           Pertinent Vitals/Pain Pain Assessment: Faces Faces Pain Scale: Hurts a little bit Pain Location: head Pain Descriptors / Indicators: Grimacing Pain Intervention(s): Monitored during session;Repositioned    Home Living Family/patient expects to be discharged to:: Private residence Living Arrangements: Spouse/significant other Available Help at Discharge: Family Type of Home: House Home Access: Stairs to enter Entrance Stairs-Rails: Psychiatric nurse of Steps: 4   Home Layout: One level Home Equipment: Conservation officer, nature (2 wheels);Cane - single point;Shower seat;Toilet riser      Prior Function Prior Level of Function : Independent/Modified Independent                     Hand Dominance        Extremity/Trunk Assessment   Upper Extremity Assessment Upper Extremity Assessment: Overall  WFL for tasks assessed    Lower Extremity Assessment Lower Extremity Assessment: Generalized weakness (L grossly 4/5)    Cervical / Trunk Assessment Cervical / Trunk Assessment: Kyphotic  Communication   Communication: No difficulties  Cognition Arousal/Alertness: Awake/alert Behavior During Therapy: WFL  for tasks assessed/performed Overall Cognitive Status: Within Functional Limits for tasks assessed                                          General Comments      Exercises     Assessment/Plan    PT Assessment Patient needs continued PT services  PT Problem List Decreased strength;Decreased activity tolerance;Decreased balance;Decreased mobility;Decreased safety awareness       PT Treatment Interventions DME instruction;Gait training;Functional mobility training;Stair training;Therapeutic activities;Therapeutic exercise;Balance training;Patient/family education    PT Goals (Current goals can be found in the Care Plan section)  Acute Rehab PT Goals Patient Stated Goal: to go home PT Goal Formulation: With patient Time For Goal Achievement: 06/23/21 Potential to Achieve Goals: Good    Frequency Min 3X/week     Co-evaluation               AM-PAC PT "6 Clicks" Mobility  Outcome Measure Help needed turning from your back to your side while in a flat bed without using bedrails?: A Little Help needed moving from lying on your back to sitting on the side of a flat bed without using bedrails?: A Little Help needed moving to and from a bed to a chair (including a wheelchair)?: A Little Help needed standing up from a chair using your arms (e.g., wheelchair or bedside chair)?: A Little Help needed to walk in hospital room?: A Little Help needed climbing 3-5 steps with a railing? : A Little 6 Click Score: 18    End of Session Equipment Utilized During Treatment: Gait belt Activity Tolerance: Patient tolerated treatment well Patient left: in chair;with call bell/phone within reach;with family/visitor present Nurse Communication: Mobility status PT Visit Diagnosis: Unsteadiness on feet (R26.81);Muscle weakness (generalized) (M62.81)    Time: 3536-1443 PT Time Calculation (min) (ACUTE ONLY): 28 min   Charges:   PT Evaluation $PT Eval Moderate Complexity:  1 Mod PT Treatments $Gait Training: 8-22 mins        Skylene Deremer A. Gilford Rile PT, DPT Acute Rehabilitation Services Pager [phone] Office [phone]   Linna Hoff 06/09/2021, 5:12 PM

## 2021-06-09 NOTE — Progress Notes (Signed)
°  NEUROSURGERY PROGRESS NOTE   Pt seen and examined. No issues overnight. Minimal HA. No new N/T/W or visual changes. Currently OOB in chair.  EXAM: Temp:  [97.7 F (36.5 C)-98.9 F (37.2 C)] 98.2 F (36.8 C) (01/13 0800) Pulse Rate:  [65-97] 75 (01/13 1000) Resp:  [11-22] 16 (01/13 1000) BP: (95-163)/(56-88) 123/82 (01/13 1000) SpO2:  [91 %-100 %] 92 % (01/13 1000) Arterial Line BP: (119-188)/(59-77) 141/63 (01/13 1000) Intake/Output      01/12 0701 01/13 0700 01/13 0701 01/14 0700   P.O.  250   I.V. (mL/kg) 1.1 (0) 75 (0.7)   IV Piggyback 299.8 100   Total Intake(mL/kg) 300.8 (2.7) 425 (3.8)   Urine (mL/kg/hr) 2875    Blood 100    Total Output 2975    Net -2674.2 +425         Awake, alert, oriented Speech fluent, appropriate CN grossly intact MAE good strength Wound c/d/I Periorbital edema  LABS: Lab Results  Component Value Date   CREATININE 0.94 06/05/2021   BUN 17 06/05/2021   NA 138 06/05/2021   K 3.8 06/05/2021   CL 105 06/05/2021   CO2 29 06/05/2021   Lab Results  Component Value Date   WBC 6.7 06/05/2021   HGB 10.7 (L) 06/05/2021   HCT 34.9 (L) 06/05/2021   MCV 81.7 06/05/2021   PLT 228 06/05/2021    IMPRESSION: - 76 y.o. male POD#1 s/p right FT crani for clipping of RMCA aneurysm, doing well  PLAN: - Cont to mobilize - d/c A-line, Foley - Can consider d/c home tomorrow   Consuella Lose, MD [facility]

## 2021-06-10 MED ORDER — LEVETIRACETAM 500 MG PO TABS: 500.0000 mg | ORAL_TABLET | Freq: Two times a day (BID) | ORAL | 0 refills | Status: DC

## 2021-06-10 MED ORDER — HYDROCODONE-ACETAMINOPHEN 5-325 MG PO TABS: 1.0000 | ORAL_TABLET | Freq: Four times a day (QID) | ORAL | 0 refills | Status: AC | PRN

## 2021-06-10 NOTE — Progress Notes (Signed)
°  NEUROSURGERY PROGRESS NOTE   No issues overnight. Minimal HA.  EXAM:  BP 128/81 (BP Location: Right Arm)    Pulse 72    Temp 98.5 F (36.9 C) (Oral)    Resp 16    Ht 6\' 2"  (1.88 m)    Wt 111.1 kg    SpO2 94%    BMI 31.46 kg/m   Awake, alert, oriented  Speech fluent, appropriate  CN grossly intact  5/5 BUE/BLE  Wound c/d/I Right eye periorbital edema  IMPRESSION:  76 y.o. male POD#2 s/p RMCA clipping, doing well  PLAN: - d/c home today   Consuella Lose, MD [facility]

## 2021-06-10 NOTE — Discharge Summary (Signed)
Physician Discharge Summary  Patient ID: William Leonard MRN: [id number] DOB/AGE: [birth year] 76 y.o.  Admit date: 06/08/2021 Discharge date: 06/10/2021  Admission Diagnoses:  Cerebral aneurysm  Discharge Diagnoses:  Same Principal Problem:   Brain aneurysm Active Problems:   Cerebral aneurysm   Discharged Condition: Stable  Hospital Course:  William Leonard is a 76 y.o. male admitted after elective RMCA aneurysm clipping. He was at neurologic baseline postop and monitored in the ICU. He was ambulating well, tolerating diet and voiding normally with HA controlled with oral medication. He therefore requested discharge home.  Treatments: Surgery - Right FT crani for RMCA aneurysm clipping  Discharge Exam: Blood pressure 128/81, pulse 72, temperature 98.5 F (36.9 C), temperature source Oral, resp. rate 16, height 6\' 2"  (1.88 m), weight 111.1 kg, SpO2 94 %. Awake, alert, oriented Speech fluent, appropriate CN grossly intact 5/5 BUE/BLE Wound c/d/i  Disposition: Discharge disposition: 01-Home or Self Care       Discharge Instructions     Ambulatory referral to Physical Therapy   Complete by: As directed    Call MD for:  redness, tenderness, or signs of infection (pain, swelling, redness, odor or green/yellow discharge around incision site)   Complete by: As directed    Call MD for:  temperature >100.4   Complete by: As directed    Diet - low sodium heart healthy   Complete by: As directed    Discharge instructions   Complete by: As directed    Walk at home as much as possible, at least 4 times / day   Increase activity slowly   Complete by: As directed    Lifting restrictions   Complete by: As directed    No lifting > 10 lbs   May shower / Bathe   Complete by: As directed    48 hours after surgery   May walk up steps   Complete by: As directed    No dressing needed   Complete by: As directed    Other Restrictions   Complete by: As directed    No  bending/twisting at waist      Allergies as of 06/10/2021       Reactions   Other Nausea And Vomiting   Blue cheese   Shellfish Allergy Nausea And Vomiting   mussels        Medication List     TAKE these medications    docusate sodium 100 MG capsule Commonly known as: COLACE Take 300 mg by mouth at bedtime.   HYDROcodone-acetaminophen 5-325 MG tablet Commonly known as: NORCO/VICODIN Take 1 tablet by mouth every 6 (six) hours as needed for up to 7 days for moderate pain.   levETIRAcetam 500 MG tablet Commonly known as: KEPPRA Take 1 tablet (500 mg total) by mouth 2 (two) times daily.   multivitamin with minerals Tabs tablet Take 1 tablet by mouth daily.   nystatin ointment Commonly known as: MYCOSTATIN Apply 1 application topically 2 (two) times daily.   pantoprazole 40 MG tablet Commonly known as: PROTONIX Take 1 tablet (40 mg total) by mouth 2 (two) times daily for 42 days, THEN 1 tablet (40 mg total) daily. Start taking on: May 09, 2021   tamsulosin 0.4 MG Caps capsule Commonly known as: FLOMAX Take 0.4 mg by mouth every other day.   triamcinolone cream 0.1 % Commonly known as: KENALOG Apply 1 application topically daily.               Discharge  Care Instructions  (From admission, onward)           Start     Ordered   06/10/21 0000  No dressing needed        06/10/21 1019            Follow-up Information     Consuella Lose, MD Follow up.   Specialty: Neurosurgery Contact information: [address] [place] [address] [phone]                 Signed: Jairo Ben 06/10/2021, 10:20 AM

## 2021-06-13 DIAGNOSIS — Z0100 Encounter for examination of eyes and vision without abnormal findings: Secondary | ICD-10-CM | POA: Diagnosis not present

## 2021-06-19 ENCOUNTER — Encounter: Payer: Self-pay | Admitting: Gastroenterology

## 2021-06-19 ENCOUNTER — Ambulatory Visit: Payer: Medicare HMO | Admitting: Gastroenterology

## 2021-06-19 VITALS — BP 140/80 | HR 87 | Ht 74.0 in | Wt 238.0 lb

## 2021-06-19 DIAGNOSIS — D638 Anemia in other chronic diseases classified elsewhere: Secondary | ICD-10-CM | POA: Diagnosis not present

## 2021-06-19 DIAGNOSIS — K269 Duodenal ulcer, unspecified as acute or chronic, without hemorrhage or perforation: Secondary | ICD-10-CM

## 2021-06-19 NOTE — Patient Instructions (Signed)
If you are age 78 or older, your body mass index should be between 23-30. Your Body mass index is 30.56 kg/m. If this is out of the aforementioned range listed, please consider follow up with your Primary Care Provider.  If you are age 41 or younger, your body mass index should be between 19-25. Your Body mass index is 30.56 kg/m. If this is out of the aformentioned range listed, please consider follow up with your Primary Care Provider.   ________________________________________________________  The [facility] providers would like to encourage you to use [facility] to communicate with providers for non-urgent requests or questions.  Due to long hold times on the telephone, sending your provider a message by [facility] may be a faster and more efficient way to get a response.  Please allow 48 business hours for a response.  Please remember that this is for non-urgent requests.  _______________________________________________________ Follow up as needed.  It was a pleasure to see you today!  Thank you for trusting me with your gastrointestinal care!
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
## 2021-06-29 ENCOUNTER — Ambulatory Visit (HOSPITAL_COMMUNITY)
Admission: RE | Admit: 2021-06-29 | Discharge: 2021-06-29 | Disposition: A | Discharge status: Home / Self Care | Payer: Medicare HMO | Source: Ambulatory Visit | Attending: Urology | Admitting: Urology

## 2021-06-29 ENCOUNTER — Encounter: Payer: Self-pay | Admitting: Urology

## 2021-06-29 ENCOUNTER — Other Ambulatory Visit: Payer: Self-pay

## 2021-06-29 ENCOUNTER — Ambulatory Visit: Payer: Medicare HMO | Admitting: Urology

## 2021-06-29 VITALS — BP 120/69 | HR 90 | Wt 240.0 lb

## 2021-06-29 DIAGNOSIS — Z87440 Personal history of urinary (tract) infections: Secondary | ICD-10-CM

## 2021-06-29 DIAGNOSIS — N201 Calculus of ureter: Secondary | ICD-10-CM | POA: Diagnosis not present

## 2021-06-29 DIAGNOSIS — R8290 Unspecified abnormal findings in urine: Secondary | ICD-10-CM

## 2021-06-29 DIAGNOSIS — Z466 Encounter for fitting and adjustment of urinary device: Secondary | ICD-10-CM | POA: Diagnosis not present

## 2021-06-29 DIAGNOSIS — I878 Other specified disorders of veins: Secondary | ICD-10-CM | POA: Diagnosis not present

## 2021-06-29 NOTE — Progress Notes (Signed)
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
## 2021-06-30 ENCOUNTER — Telehealth: Payer: Self-pay

## 2021-06-30 LAB — MICROSCOPIC EXAMINATION: Renal Epithel, UA: NONE SEEN /HPF

## 2021-06-30 LAB — URINALYSIS, ROUTINE W REFLEX MICROSCOPIC
Bilirubin, UA: NEGATIVE
Glucose, UA: NEGATIVE
Nitrite, UA: NEGATIVE
Specific Gravity, UA: >1.03 — ABNORMAL HIGH (ref 1.005–1.030)
Urobilinogen, Ur: 0.2 mg/dL (ref 0.2–1.0)
pH, UA: 5.5 (ref 5.0–7.5)

## 2021-06-30 NOTE — Telephone Encounter (Signed)
-----   Message from Primus Bravo, MD sent at 06/29/2021  5:10 PM EST ----- Please notify patient that it appears that the stone has passed.   Please schedule him for cystoscopy and stent removal on 07/05/21.

## 2021-06-30 NOTE — Telephone Encounter (Signed)
Called patient, left message of appt date for 02/08 given on machine and to call office if unable to keep that appointment.,

## 2021-07-01 LAB — URINE CULTURE: Organism ID, Bacteria: NO GROWTH

## 2021-07-05 ENCOUNTER — Encounter: Payer: Self-pay | Admitting: Urology

## 2021-07-05 ENCOUNTER — Other Ambulatory Visit: Payer: Self-pay

## 2021-07-05 ENCOUNTER — Ambulatory Visit (INDEPENDENT_AMBULATORY_CARE_PROVIDER_SITE_OTHER): Payer: Medicare HMO | Admitting: Urology

## 2021-07-05 VITALS — BP 157/77 | HR 82 | Ht 74.0 in | Wt 240.0 lb

## 2021-07-05 DIAGNOSIS — N201 Calculus of ureter: Secondary | ICD-10-CM

## 2021-07-05 DIAGNOSIS — Z87440 Personal history of urinary (tract) infections: Secondary | ICD-10-CM

## 2021-07-05 LAB — URINALYSIS, ROUTINE W REFLEX MICROSCOPIC
Bilirubin, UA: NEGATIVE
Glucose, UA: NEGATIVE
Ketones, UA: NEGATIVE
Nitrite, UA: NEGATIVE
Specific Gravity, UA: 1.025 (ref 1.005–1.030)
Urobilinogen, Ur: 0.2 mg/dL (ref 0.2–1.0)
pH, UA: 6 (ref 5.0–7.5)

## 2021-07-05 LAB — MICROSCOPIC EXAMINATION
Bacteria, UA: NONE SEEN
Renal Epithel, UA: NONE SEEN /hpf

## 2021-07-05 MED ORDER — CIPROFLOXACIN HCL 500 MG PO TABS
500.0000 mg | ORAL_TABLET | Freq: Once | ORAL | Status: AC
  Administered 2021-07-05: 500 mg via ORAL
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
## 2021-07-10 DIAGNOSIS — I10 Essential (primary) hypertension: Secondary | ICD-10-CM | POA: Diagnosis not present

## 2021-07-10 DIAGNOSIS — N200 Calculus of kidney: Secondary | ICD-10-CM | POA: Diagnosis not present

## 2021-07-10 DIAGNOSIS — E7849 Other hyperlipidemia: Secondary | ICD-10-CM | POA: Diagnosis not present

## 2021-07-10 DIAGNOSIS — M6281 Muscle weakness (generalized): Secondary | ICD-10-CM | POA: Diagnosis not present

## 2021-07-10 DIAGNOSIS — I671 Cerebral aneurysm, nonruptured: Secondary | ICD-10-CM | POA: Diagnosis not present

## 2021-07-12 DIAGNOSIS — M6281 Muscle weakness (generalized): Secondary | ICD-10-CM | POA: Diagnosis not present

## 2021-07-18 DIAGNOSIS — M6281 Muscle weakness (generalized): Secondary | ICD-10-CM | POA: Diagnosis not present

## 2021-07-20 DIAGNOSIS — M6281 Muscle weakness (generalized): Secondary | ICD-10-CM | POA: Diagnosis not present

## 2021-07-24 DIAGNOSIS — M6281 Muscle weakness (generalized): Secondary | ICD-10-CM | POA: Diagnosis not present

## 2021-07-27 DIAGNOSIS — M6281 Muscle weakness (generalized): Secondary | ICD-10-CM | POA: Diagnosis not present

## 2021-07-31 ENCOUNTER — Other Ambulatory Visit: Payer: Self-pay

## 2021-07-31 ENCOUNTER — Encounter: Payer: Self-pay | Admitting: Urology

## 2021-07-31 ENCOUNTER — Ambulatory Visit: Payer: Medicare HMO | Admitting: Urology

## 2021-07-31 VITALS — BP 151/77 | HR 89 | Ht 74.0 in | Wt 240.0 lb

## 2021-07-31 DIAGNOSIS — Z87440 Personal history of urinary (tract) infections: Secondary | ICD-10-CM | POA: Diagnosis not present

## 2021-07-31 DIAGNOSIS — N201 Calculus of ureter: Secondary | ICD-10-CM

## 2021-07-31 LAB — URINALYSIS, ROUTINE W REFLEX MICROSCOPIC
Bilirubin, UA: NEGATIVE
Glucose, UA: NEGATIVE
Ketones, UA: NEGATIVE
Leukocytes,UA: NEGATIVE
Nitrite, UA: NEGATIVE
Protein,UA: NEGATIVE
RBC, UA: NEGATIVE
Specific Gravity, UA: 1.02 (ref 1.005–1.030)
Urobilinogen, Ur: 0.2 mg/dL (ref 0.2–1.0)
pH, UA: 7 (ref 5.0–7.5)

## 2021-07-31 NOTE — Progress Notes (Signed)
? ?Assessment: ?1. Ureteral calculus, left   ?2. History of UTI   ? ? ?Plan: ?Continue tamsulosin ?Stone prevention discussed. ?Return to office as needed. ? ?Chief Complaint:  ?Chief Complaint  ?Patient presents with  ? ureteral calculus  ? ? ?History of Present Illness: ? ?William Leonard is a 76 y.o. year old male who is seen  for further evaluation of a left ureteral calculus with associated UTI.  He presented to the emergency room at [facility] in [place] on 01/30/2021 with fever to 105 and confusion.  He was found to have a UTI.  Urine culture grew E. coli.  Blood cultures were also positive for E. coli.  CT imaging showed a 5 mm obstructing calculus in the left proximal ureter with hydronephrosis.  The patient underwent cystoscopy with a left retrograde pyelogram and left ureteral stent placement.  He was admitted to the ICU for septic shock and AKI.  He was eventually discharged from the hospital on 02/25/2021.  Since discharge he was again hospitalized on 03/19/2021 for a CVA.  He presented to the emergency room in [place] on 03/23/2021 with fever.  CT imaging showed the left ureteral stent in good position with a 4 mm stone alongside the stent in the distal left ureter.  Urine culture grew >100 K E. coli, pansensitive.  He has continued on cephalexin 500 mg 3 times daily.  He was not having any flank pain.   ?He does report difficulty with retraction of the foreskin which began recently. ? ?Urine culture from 03/30/2021 showed no evidence of infection. ?KUB from 03/31/2021 showed a persistent 5 mm calculus adjacent to the distal aspect of the left ureteral stent. ?He was scheduled for cystoscopy, possible ureteroscopy, and stent exchange.  This was postponed due to his diagnosis of an aneurysm.  He underwent a management of the aneurysm on 06/08/21. ?CT imaging from 05/04/2021 showed the left ureteral stent in good position with a 4 mm stone possibly in the bladder. ?He has done well  since his aneurysm surgery.  He is not having any urinary symptoms at the present time.  No gross hematuria or flank pain.  He is no longer taking the daily Macrobid.  He continues on tamsulosin.  ?KUB from 06/29/2021 showed the left ureteral stent in good position and no obvious ureteral calculus. ?Urine culture from 06/29/2021 showed no growth. ? ?His ureteral stent was removed on 07/05/2021. ? ?He returns today for follow-up.  He has done well following the stent removal.  No flank pain.  No dysuria or gross hematuria.  He continues on tamsulosin every other day.  His urinary symptoms remain stable.  He does have nocturia x2. ?IPSS = 3 today. ? ?Portions of the above documentation were copied from a prior visit for review purposes only. ? ? ?Past Medical History:  ?Past Medical History:  ?Diagnosis Date  ? BPH with urinary obstruction   ? Diabetes mellitus without complication ([place])   ? Fatty liver   ? Hyperlipidemia   ? Stroke [facility]) 01/2021  ? ? ?Past Surgical History:  ?Past Surgical History:  ?Procedure Laterality Date  ? BIOPSY  05/08/2021  ? Procedure: BIOPSY;  Surgeon: Yetta Flock, MD;  Location: [place];  Service: Gastroenterology;;  ? CRANIOTOMY Right 06/08/2021  ? Procedure: FRONTOTEMPORAL CRANIOTOMY FOR CLIPPING OF MCA ANEURYSM;  Surgeon: Consuella Lose, MD;  Location: [place];  Service: Neurosurgery;  Laterality: Right;  ? CYSTOSCOPY    ? ESOPHAGOGASTRODUODENOSCOPY (EGD) WITH  PROPOFOL N/A 05/08/2021  ? Procedure: ESOPHAGOGASTRODUODENOSCOPY (EGD) WITH PROPOFOL;  Surgeon: Yetta Flock, MD;  Location: [place];  Service: Gastroenterology;  Laterality: N/A;  ? IR 3D INDEPENDENT WKST  04/04/2021  ? IR ANGIO INTRA EXTRACRAN SEL INTERNAL CAROTID BILAT MOD SED  04/04/2021  ? IR ANGIO VERTEBRAL SEL VERTEBRAL UNI L MOD SED  04/04/2021  ? STERNOCLEIDOMASTOID FOR TORTICOLLIS  1951  ? ? ?Allergies:  ?Allergies  ?Allergen Reactions  ? Other Nausea And Vomiting  ?  Blue cheese ?  ? Shellfish  Allergy Nausea And Vomiting  ?  mussels  ? ? ?Family History:  ?No family history on file. ? ?Social History:  ?Social History  ? ?Tobacco Use  ? Smoking status: Former  ?  Types: Cigarettes  ? Smokeless tobacco: Never  ?Vaping Use  ? Vaping Use: Never used  ?Substance Use Topics  ? Alcohol use: Yes  ?  Comment: occasional-about 1 beer a month  ? Drug use: Not Currently  ? ? ?ROS: ?Constitutional:  Negative for fever, chills, weight loss ?CV: Negative for chest pain, previous MI, hypertension ?Respiratory:  Negative for shortness of breath, wheezing, sleep apnea, frequent cough ?GI:  Negative for nausea, vomiting, bloody stool, GERD ? ?Physical exam: ?BP (!) 151/77   Pulse 89   Ht '6\' 2"'$  (1.88 m)   Wt 240 lb (108.9 kg)   BMI 30.81 kg/m?  ?GENERAL APPEARANCE:  Well appearing, well developed, well nourished, NAD ?HEENT:  Atraumatic, normocephalic, oropharynx clear ?NECK:  Supple without lymphadenopathy or thyromegaly ?ABDOMEN:  Soft, non-tender, no masses ?EXTREMITIES:  Moves all extremities well, without clubbing, cyanosis, or edema ?NEUROLOGIC:  Alert and oriented x 3, normal gait, CN II-XII grossly intact ?MENTAL STATUS:  appropriate ?BACK:  Non-tender to palpation, No CVAT ?SKIN:  Warm, dry, and intact ? ? ?Results: ?U/A: trace protein ? ?

## 2021-08-01 DIAGNOSIS — M6281 Muscle weakness (generalized): Secondary | ICD-10-CM | POA: Diagnosis not present

## 2021-08-02 DIAGNOSIS — M6281 Muscle weakness (generalized): Secondary | ICD-10-CM | POA: Diagnosis not present

## 2021-08-09 DIAGNOSIS — E782 Mixed hyperlipidemia: Secondary | ICD-10-CM | POA: Diagnosis not present

## 2021-08-09 DIAGNOSIS — R5383 Other fatigue: Secondary | ICD-10-CM | POA: Diagnosis not present

## 2021-08-09 DIAGNOSIS — K2100 Gastro-esophageal reflux disease with esophagitis, without bleeding: Secondary | ICD-10-CM | POA: Diagnosis not present

## 2021-08-09 DIAGNOSIS — I10 Essential (primary) hypertension: Secondary | ICD-10-CM | POA: Diagnosis not present

## 2021-08-09 DIAGNOSIS — E1165 Type 2 diabetes mellitus with hyperglycemia: Secondary | ICD-10-CM | POA: Diagnosis not present

## 2021-08-14 DIAGNOSIS — K2100 Gastro-esophageal reflux disease with esophagitis, without bleeding: Secondary | ICD-10-CM | POA: Diagnosis not present

## 2021-08-14 DIAGNOSIS — E1165 Type 2 diabetes mellitus with hyperglycemia: Secondary | ICD-10-CM | POA: Diagnosis not present

## 2021-08-14 DIAGNOSIS — R5383 Other fatigue: Secondary | ICD-10-CM | POA: Diagnosis not present

## 2021-08-14 DIAGNOSIS — E7849 Other hyperlipidemia: Secondary | ICD-10-CM | POA: Diagnosis not present

## 2021-08-14 DIAGNOSIS — I10 Essential (primary) hypertension: Secondary | ICD-10-CM | POA: Diagnosis not present

## 2021-08-21 DIAGNOSIS — I10 Essential (primary) hypertension: Secondary | ICD-10-CM | POA: Diagnosis not present

## 2021-08-21 DIAGNOSIS — R809 Proteinuria, unspecified: Secondary | ICD-10-CM | POA: Diagnosis not present

## 2021-08-21 DIAGNOSIS — E1129 Type 2 diabetes mellitus with other diabetic kidney complication: Secondary | ICD-10-CM | POA: Diagnosis not present

## 2021-08-21 DIAGNOSIS — E7849 Other hyperlipidemia: Secondary | ICD-10-CM | POA: Diagnosis not present

## 2021-08-21 DIAGNOSIS — M791 Myalgia, unspecified site: Secondary | ICD-10-CM | POA: Diagnosis not present

## 2021-08-21 DIAGNOSIS — N401 Enlarged prostate with lower urinary tract symptoms: Secondary | ICD-10-CM | POA: Diagnosis not present

## 2021-08-21 DIAGNOSIS — R748 Abnormal levels of other serum enzymes: Secondary | ICD-10-CM | POA: Diagnosis not present

## 2021-08-21 DIAGNOSIS — Z6833 Body mass index (BMI) 33.0-33.9, adult: Secondary | ICD-10-CM | POA: Diagnosis not present

## 2021-08-21 DIAGNOSIS — T466X5A Adverse effect of antihyperlipidemic and antiarteriosclerotic drugs, initial encounter: Secondary | ICD-10-CM | POA: Diagnosis not present

## 2021-08-21 DIAGNOSIS — E782 Mixed hyperlipidemia: Secondary | ICD-10-CM | POA: Diagnosis not present

## 2021-08-23 DIAGNOSIS — H34231 Retinal artery branch occlusion, right eye: Secondary | ICD-10-CM | POA: Diagnosis not present

## 2021-08-23 DIAGNOSIS — H2512 Age-related nuclear cataract, left eye: Secondary | ICD-10-CM | POA: Diagnosis not present

## 2021-08-23 DIAGNOSIS — H3582 Retinal ischemia: Secondary | ICD-10-CM | POA: Diagnosis not present

## 2021-08-23 DIAGNOSIS — H18413 Arcus senilis, bilateral: Secondary | ICD-10-CM | POA: Diagnosis not present

## 2021-08-23 DIAGNOSIS — H2513 Age-related nuclear cataract, bilateral: Secondary | ICD-10-CM | POA: Diagnosis not present

## 2021-08-25 DIAGNOSIS — E782 Mixed hyperlipidemia: Secondary | ICD-10-CM | POA: Diagnosis not present

## 2021-08-25 DIAGNOSIS — I10 Essential (primary) hypertension: Secondary | ICD-10-CM | POA: Diagnosis not present

## 2021-08-25 DIAGNOSIS — E7800 Pure hypercholesterolemia, unspecified: Secondary | ICD-10-CM | POA: Diagnosis not present

## 2021-08-25 DIAGNOSIS — E1129 Type 2 diabetes mellitus with other diabetic kidney complication: Secondary | ICD-10-CM | POA: Diagnosis not present

## 2021-09-07 DIAGNOSIS — S338XXA Sprain of other parts of lumbar spine and pelvis, initial encounter: Secondary | ICD-10-CM | POA: Diagnosis not present

## 2021-09-07 DIAGNOSIS — M9903 Segmental and somatic dysfunction of lumbar region: Secondary | ICD-10-CM | POA: Diagnosis not present

## 2021-09-07 DIAGNOSIS — S233XXA Sprain of ligaments of thoracic spine, initial encounter: Secondary | ICD-10-CM | POA: Diagnosis not present

## 2021-09-07 DIAGNOSIS — S134XXA Sprain of ligaments of cervical spine, initial encounter: Secondary | ICD-10-CM | POA: Diagnosis not present

## 2021-09-07 DIAGNOSIS — M9901 Segmental and somatic dysfunction of cervical region: Secondary | ICD-10-CM | POA: Diagnosis not present

## 2021-09-07 DIAGNOSIS — M9902 Segmental and somatic dysfunction of thoracic region: Secondary | ICD-10-CM | POA: Diagnosis not present

## 2021-09-24 DIAGNOSIS — I10 Essential (primary) hypertension: Secondary | ICD-10-CM | POA: Diagnosis not present

## 2021-09-24 DIAGNOSIS — E782 Mixed hyperlipidemia: Secondary | ICD-10-CM | POA: Diagnosis not present

## 2021-09-24 DIAGNOSIS — E7800 Pure hypercholesterolemia, unspecified: Secondary | ICD-10-CM | POA: Diagnosis not present

## 2021-09-24 DIAGNOSIS — E1129 Type 2 diabetes mellitus with other diabetic kidney complication: Secondary | ICD-10-CM | POA: Diagnosis not present

## 2021-09-26 DIAGNOSIS — H2511 Age-related nuclear cataract, right eye: Secondary | ICD-10-CM | POA: Diagnosis not present

## 2021-09-26 DIAGNOSIS — H2512 Age-related nuclear cataract, left eye: Secondary | ICD-10-CM | POA: Diagnosis not present

## 2021-10-09 DIAGNOSIS — H2512 Age-related nuclear cataract, left eye: Secondary | ICD-10-CM | POA: Diagnosis not present

## 2021-11-24 DIAGNOSIS — E782 Mixed hyperlipidemia: Secondary | ICD-10-CM | POA: Diagnosis not present

## 2021-11-24 DIAGNOSIS — I10 Essential (primary) hypertension: Secondary | ICD-10-CM | POA: Diagnosis not present

## 2021-11-24 DIAGNOSIS — E7800 Pure hypercholesterolemia, unspecified: Secondary | ICD-10-CM | POA: Diagnosis not present

## 2022-01-04 DIAGNOSIS — E1165 Type 2 diabetes mellitus with hyperglycemia: Secondary | ICD-10-CM | POA: Diagnosis not present

## 2022-01-04 DIAGNOSIS — E7849 Other hyperlipidemia: Secondary | ICD-10-CM | POA: Diagnosis not present

## 2022-01-04 DIAGNOSIS — R739 Hyperglycemia, unspecified: Secondary | ICD-10-CM | POA: Diagnosis not present

## 2022-01-04 DIAGNOSIS — I10 Essential (primary) hypertension: Secondary | ICD-10-CM | POA: Diagnosis not present

## 2022-01-04 DIAGNOSIS — E1129 Type 2 diabetes mellitus with other diabetic kidney complication: Secondary | ICD-10-CM | POA: Diagnosis not present

## 2022-01-10 DIAGNOSIS — E1129 Type 2 diabetes mellitus with other diabetic kidney complication: Secondary | ICD-10-CM | POA: Diagnosis not present

## 2022-01-10 DIAGNOSIS — M791 Myalgia, unspecified site: Secondary | ICD-10-CM | POA: Diagnosis not present

## 2022-01-10 DIAGNOSIS — N401 Enlarged prostate with lower urinary tract symptoms: Secondary | ICD-10-CM | POA: Diagnosis not present

## 2022-01-10 DIAGNOSIS — I10 Essential (primary) hypertension: Secondary | ICD-10-CM | POA: Diagnosis not present

## 2022-01-10 DIAGNOSIS — R809 Proteinuria, unspecified: Secondary | ICD-10-CM | POA: Diagnosis not present

## 2022-01-10 DIAGNOSIS — R748 Abnormal levels of other serum enzymes: Secondary | ICD-10-CM | POA: Diagnosis not present

## 2022-01-10 DIAGNOSIS — Z0001 Encounter for general adult medical examination with abnormal findings: Secondary | ICD-10-CM | POA: Diagnosis not present

## 2022-01-10 DIAGNOSIS — E7849 Other hyperlipidemia: Secondary | ICD-10-CM | POA: Diagnosis not present

## 2022-01-10 DIAGNOSIS — T466X5A Adverse effect of antihyperlipidemic and antiarteriosclerotic drugs, initial encounter: Secondary | ICD-10-CM | POA: Diagnosis not present

## 2022-01-31 ENCOUNTER — Telehealth: Payer: Self-pay | Admitting: *Deleted

## 2022-01-31 NOTE — Patient Outreach (Signed)
  Care Coordination   01/31/2022 Name: William Leonard MRN: [id number] DOB: June 01, 1945   Care Coordination Outreach Attempts:  An unsuccessful telephone outreach was attempted today to offer the patient information about available care coordination services as a benefit of their health plan.   Follow Up Plan:  Additional outreach attempts will be made to offer the patient care coordination information and services.   Encounter Outcome:  No Answer  Care Coordination Interventions Activated:  No   Care Coordination Interventions:  No, not indicated    Valente David, RN, MSN, [facility] Care Management Care Management Coordinator [phone]

## 2022-02-05 ENCOUNTER — Telehealth: Payer: Self-pay | Admitting: *Deleted

## 2022-02-05 NOTE — Patient Outreach (Signed)
  Care Coordination   02/05/2022 Name: William Leonard MRN: [id number] DOB: 03/16/1946   Care Coordination Outreach Attempts:  A second unsuccessful outreach was attempted today to offer the patient with information about available care coordination services as a benefit of their health plan.     Follow Up Plan:  Additional outreach attempts will be made to offer the patient care coordination information and services.   Encounter Outcome:  No Answer  Care Coordination Interventions Activated:  No   Care Coordination Interventions:  No, not indicated    Valente David, RN, MSN, [facility] Care Management Care Management Coordinator [phone]

## 2022-02-08 ENCOUNTER — Telehealth: Payer: Self-pay | Admitting: *Deleted

## 2022-02-08 ENCOUNTER — Encounter: Payer: Self-pay | Admitting: *Deleted

## 2022-02-08 DIAGNOSIS — Z1211 Encounter for screening for malignant neoplasm of colon: Secondary | ICD-10-CM | POA: Diagnosis not present

## 2022-02-08 DIAGNOSIS — Z86010 Personal history of colonic polyps: Secondary | ICD-10-CM | POA: Diagnosis not present

## 2022-02-08 NOTE — Patient Outreach (Signed)
  Care Coordination   Initial Visit Note   02/08/2022 Name: William Leonard MRN: [id number] DOB: [birth year]  William Leonard is a 76 y.o. year old male who sees Sasser, Silvestre Moment, MD for primary care. I spoke with  William Leonard by phone today.  What matters to the patients health and wellness today?  Report he is doing well, denies need for further [facility] involvement at this time.  Denies any urgent concerns, encouraged to contact this care manager with questions.  Agrees to follow up within the next month.     Goals Addressed             This Visit's Progress    COMPLETED: Care Coordination Activities - No follow up needed       Care Coordination Interventions: Patient interviewed about adult health maintenance status including  Colonoscopy    Depression screen    Falls risk assessment    Regular eye checkups Regular Dental Care    Blood Pressure    Advised patient to discuss  Pneumonia Vaccine Influenza Vaccine COVID vaccination    with primary care provider  Provided education about need for AWV and overall health maintenance SDOH assessment complete         SDOH assessments and interventions completed:  Yes  SDOH Interventions Today    Flowsheet Row Most Recent Value  SDOH Interventions   Food Insecurity Interventions Intervention Not Indicated  Housing Interventions Intervention Not Indicated  Transportation Interventions Intervention Not Indicated  Utilities Interventions Intervention Not Indicated        Care Coordination Interventions Activated:  Yes  Care Coordination Interventions:  Yes, provided   Follow up plan: No further intervention required.   Encounter Outcome:  Pt. Visit Completed   Valente David, RN, MSN, [facility] Care Management Care Management Coordinator [phone]

## 2022-02-08 NOTE — Patient Instructions (Signed)
Visit Information  Thank you for taking time to visit with me today. Please don't hesitate to contact me if I can be of assistance to you.  Following are the goals we discussed today:  Call PCP office to schedule annual wellness visit  Please call the Suicide and Crisis Lifeline: 988 call the [place] National Suicide Prevention Lifeline: 201-849-7677 or TTY: 650-673-8486 TTY 334 324 5318) to talk to a trained counselor call 1-800-273-TALK (toll free, 24 hour hotline) call the [facility]: [phone] call 911 if you are experiencing a Mental Health or [place] or need someone to talk to.  The patient verbalized understanding of instructions, educational materials, and care plan provided today and agreed to receive a mailed copy of patient instructions, educational materials, and care plan.   The patient has been provided with contact information for the care management team and has been advised to call with any health related questions or concerns.   Valente David, RN, MSN, [facility] Care Management Care Management Coordinator [phone]

## 2022-03-02 DIAGNOSIS — Z86010 Personal history of colonic polyps: Secondary | ICD-10-CM | POA: Diagnosis not present

## 2022-03-02 DIAGNOSIS — N400 Enlarged prostate without lower urinary tract symptoms: Secondary | ICD-10-CM | POA: Diagnosis not present

## 2022-03-02 DIAGNOSIS — D124 Benign neoplasm of descending colon: Secondary | ICD-10-CM | POA: Diagnosis not present

## 2022-03-02 DIAGNOSIS — D126 Benign neoplasm of colon, unspecified: Secondary | ICD-10-CM | POA: Diagnosis not present

## 2022-03-02 DIAGNOSIS — K5730 Diverticulosis of large intestine without perforation or abscess without bleeding: Secondary | ICD-10-CM | POA: Diagnosis not present

## 2022-03-02 DIAGNOSIS — Z79899 Other long term (current) drug therapy: Secondary | ICD-10-CM | POA: Diagnosis not present

## 2022-03-02 DIAGNOSIS — D125 Benign neoplasm of sigmoid colon: Secondary | ICD-10-CM | POA: Diagnosis not present

## 2022-03-02 DIAGNOSIS — M1990 Unspecified osteoarthritis, unspecified site: Secondary | ICD-10-CM | POA: Diagnosis not present

## 2022-03-02 DIAGNOSIS — Z1211 Encounter for screening for malignant neoplasm of colon: Secondary | ICD-10-CM | POA: Diagnosis not present

## 2022-03-02 DIAGNOSIS — Z87891 Personal history of nicotine dependence: Secondary | ICD-10-CM | POA: Diagnosis not present

## 2022-03-02 DIAGNOSIS — N189 Chronic kidney disease, unspecified: Secondary | ICD-10-CM | POA: Diagnosis not present

## 2022-03-02 DIAGNOSIS — I129 Hypertensive chronic kidney disease with stage 1 through stage 4 chronic kidney disease, or unspecified chronic kidney disease: Secondary | ICD-10-CM | POA: Diagnosis not present

## 2022-03-02 DIAGNOSIS — D129 Benign neoplasm of anus and anal canal: Secondary | ICD-10-CM | POA: Diagnosis not present

## 2022-03-15 DIAGNOSIS — D124 Benign neoplasm of descending colon: Secondary | ICD-10-CM | POA: Diagnosis not present

## 2022-05-03 DIAGNOSIS — H35033 Hypertensive retinopathy, bilateral: Secondary | ICD-10-CM | POA: Diagnosis not present

## 2022-05-04 DIAGNOSIS — E782 Mixed hyperlipidemia: Secondary | ICD-10-CM | POA: Diagnosis not present

## 2022-05-04 DIAGNOSIS — E7849 Other hyperlipidemia: Secondary | ICD-10-CM | POA: Diagnosis not present

## 2022-05-04 DIAGNOSIS — R5383 Other fatigue: Secondary | ICD-10-CM | POA: Diagnosis not present

## 2022-05-04 DIAGNOSIS — E1165 Type 2 diabetes mellitus with hyperglycemia: Secondary | ICD-10-CM | POA: Diagnosis not present

## 2022-05-08 ENCOUNTER — Encounter (INDEPENDENT_AMBULATORY_CARE_PROVIDER_SITE_OTHER): Payer: Medicare HMO | Admitting: Ophthalmology

## 2022-05-08 DIAGNOSIS — Z6836 Body mass index (BMI) 36.0-36.9, adult: Secondary | ICD-10-CM | POA: Diagnosis not present

## 2022-05-08 DIAGNOSIS — R809 Proteinuria, unspecified: Secondary | ICD-10-CM | POA: Diagnosis not present

## 2022-05-08 DIAGNOSIS — T466X5A Adverse effect of antihyperlipidemic and antiarteriosclerotic drugs, initial encounter: Secondary | ICD-10-CM | POA: Diagnosis not present

## 2022-05-08 DIAGNOSIS — E1129 Type 2 diabetes mellitus with other diabetic kidney complication: Secondary | ICD-10-CM | POA: Diagnosis not present

## 2022-05-08 DIAGNOSIS — R748 Abnormal levels of other serum enzymes: Secondary | ICD-10-CM | POA: Diagnosis not present

## 2022-05-08 DIAGNOSIS — M791 Myalgia, unspecified site: Secondary | ICD-10-CM | POA: Diagnosis not present

## 2022-05-08 DIAGNOSIS — E7849 Other hyperlipidemia: Secondary | ICD-10-CM | POA: Diagnosis not present

## 2022-05-08 DIAGNOSIS — I10 Essential (primary) hypertension: Secondary | ICD-10-CM | POA: Diagnosis not present

## 2022-05-08 DIAGNOSIS — N401 Enlarged prostate with lower urinary tract symptoms: Secondary | ICD-10-CM | POA: Diagnosis not present

## 2022-05-30 ENCOUNTER — Encounter (INDEPENDENT_AMBULATORY_CARE_PROVIDER_SITE_OTHER): Payer: Medicare HMO | Admitting: Ophthalmology

## 2022-05-30 DIAGNOSIS — H34212 Partial retinal artery occlusion, left eye: Secondary | ICD-10-CM | POA: Diagnosis not present

## 2022-05-30 DIAGNOSIS — I10 Essential (primary) hypertension: Secondary | ICD-10-CM

## 2022-05-30 DIAGNOSIS — H35033 Hypertensive retinopathy, bilateral: Secondary | ICD-10-CM | POA: Diagnosis not present

## 2022-05-30 DIAGNOSIS — H47211 Primary optic atrophy, right eye: Secondary | ICD-10-CM

## 2022-05-30 DIAGNOSIS — H43813 Vitreous degeneration, bilateral: Secondary | ICD-10-CM

## 2022-06-07 ENCOUNTER — Other Ambulatory Visit: Payer: Self-pay | Admitting: Neurosurgery

## 2022-06-07 DIAGNOSIS — I671 Cerebral aneurysm, nonruptured: Secondary | ICD-10-CM

## 2022-06-08 ENCOUNTER — Other Ambulatory Visit: Payer: Self-pay | Admitting: Neurosurgery

## 2022-06-08 DIAGNOSIS — H349 Unspecified retinal vascular occlusion: Secondary | ICD-10-CM | POA: Diagnosis not present

## 2022-06-08 DIAGNOSIS — I77810 Thoracic aortic ectasia: Secondary | ICD-10-CM | POA: Diagnosis not present

## 2022-06-08 DIAGNOSIS — Z8673 Personal history of transient ischemic attack (TIA), and cerebral infarction without residual deficits: Secondary | ICD-10-CM | POA: Diagnosis not present

## 2022-06-08 DIAGNOSIS — I6523 Occlusion and stenosis of bilateral carotid arteries: Secondary | ICD-10-CM | POA: Diagnosis not present

## 2022-06-08 DIAGNOSIS — I358 Other nonrheumatic aortic valve disorders: Secondary | ICD-10-CM | POA: Diagnosis not present

## 2022-06-15 ENCOUNTER — Ambulatory Visit (HOSPITAL_COMMUNITY)
Admission: RE | Admit: 2022-06-15 | Discharge: 2022-06-15 | Disposition: A | Discharge status: Home / Self Care | Payer: Medicare HMO | Source: Ambulatory Visit | Attending: Neurosurgery | Admitting: Neurosurgery

## 2022-06-15 ENCOUNTER — Other Ambulatory Visit: Payer: Self-pay

## 2022-06-15 ENCOUNTER — Other Ambulatory Visit: Payer: Self-pay | Admitting: Neurosurgery

## 2022-06-15 DIAGNOSIS — I671 Cerebral aneurysm, nonruptured: Secondary | ICD-10-CM | POA: Diagnosis not present

## 2022-06-15 DIAGNOSIS — Z87891 Personal history of nicotine dependence: Secondary | ICD-10-CM | POA: Diagnosis not present

## 2022-06-15 DIAGNOSIS — Z48812 Encounter for surgical aftercare following surgery on the circulatory system: Secondary | ICD-10-CM | POA: Diagnosis not present

## 2022-06-15 HISTORY — PX: IR ANGIO INTRA EXTRACRAN SEL INTERNAL CAROTID UNI R MOD SED: IMG5362

## 2022-06-15 LAB — BASIC METABOLIC PANEL
Anion gap: 8 (ref 5–15)
BUN: 15 mg/dL (ref 8–23)
CO2: 26 mmol/L (ref 22–32)
Calcium: 9.3 mg/dL (ref 8.9–10.3)
Chloride: 102 mmol/L (ref 98–111)
Creatinine, Ser: 0.93 mg/dL (ref 0.61–1.24)
GFR, Estimated: >60 mL/min (ref >60)
Glucose, Bld: 140 mg/dL — ABNORMAL HIGH (ref 70–99)
Potassium: 4.4 mmol/L (ref 3.5–5.1)
Sodium: 136 mmol/L (ref 135–145)

## 2022-06-15 LAB — PROTIME-INR
INR: 1 (ref 0.8–1.2)
Prothrombin Time: 13.3 seconds (ref 11.4–15.2)

## 2022-06-15 LAB — CBC WITH DIFFERENTIAL/PLATELET
Abs Immature Granulocytes: 0.02 10*3/uL (ref 0.00–0.07)
Basophils Absolute: 0.1 10*3/uL (ref 0.0–0.1)
Basophils Relative: 1 %
Eosinophils Absolute: 0.2 10*3/uL (ref 0.0–0.5)
Eosinophils Relative: 4 %
HCT: 44.1 % (ref 39.0–52.0)
Hemoglobin: 14.7 g/dL (ref 13.0–17.0)
Immature Granulocytes: 0 %
Lymphocytes Relative: 23 %
Lymphs Abs: 1.2 10*3/uL (ref 0.7–4.0)
MCH: 28.2 pg (ref 26.0–34.0)
MCHC: 33.3 g/dL (ref 30.0–36.0)
MCV: 84.5 fL (ref 80.0–100.0)
Monocytes Absolute: 0.5 10*3/uL (ref 0.1–1.0)
Monocytes Relative: 10 %
Neutro Abs: 3.3 10*3/uL (ref 1.7–7.7)
Neutrophils Relative %: 62 %
Platelets: 164 10*3/uL (ref 150–400)
RBC: 5.22 MIL/uL (ref 4.22–5.81)
RDW: 13.5 % (ref 11.5–15.5)
WBC: 5.3 10*3/uL (ref 4.0–10.5)
nRBC: 0 % (ref 0.0–0.2)

## 2022-06-15 LAB — APTT: aPTT: 28 seconds (ref 24–36)

## 2022-06-15 MED ORDER — FENTANYL CITRATE (PF) 100 MCG/2ML IJ SOLN
INTRAMUSCULAR | Status: AC | PRN
  Administered 2022-06-15: 25 ug via INTRAVENOUS

## 2022-06-15 MED ORDER — CEFAZOLIN SODIUM-DEXTROSE 2-4 GM/100ML-% IV SOLN: 2.0000 g | INTRAVENOUS | Status: DC

## 2022-06-15 MED ORDER — LIDOCAINE HCL 1 % IJ SOLN
INTRAMUSCULAR | Status: AC
  Filled 2022-06-15: qty 20

## 2022-06-15 MED ORDER — VERAPAMIL HCL 2.5 MG/ML IV SOLN: INTRA_ARTERIAL | Status: AC | PRN

## 2022-06-15 MED ORDER — NITROGLYCERIN 1 MG/10 ML FOR IR/CATH LAB
INTRA_ARTERIAL | Status: AC
  Filled 2022-06-15: qty 10

## 2022-06-15 MED ORDER — IOHEXOL 300 MG/ML  SOLN
100.0000 mL | Freq: Once | INTRAMUSCULAR | Status: AC | PRN
  Administered 2022-06-15: 20 mL via INTRA_ARTERIAL

## 2022-06-15 MED ORDER — CHLORHEXIDINE GLUCONATE CLOTH 2 % EX PADS: 6.0000 | MEDICATED_PAD | Freq: Once | CUTANEOUS | Status: DC

## 2022-06-15 MED ORDER — HEPARIN SODIUM (PORCINE) 1000 UNIT/ML IJ SOLN
INTRAMUSCULAR | Status: AC | PRN
  Administered 2022-06-15: 3000 [IU] via INTRAVENOUS

## 2022-06-15 MED ORDER — SODIUM CHLORIDE 0.9 % IV SOLN: INTRAVENOUS | Status: DC

## 2022-06-15 MED ORDER — VERAPAMIL HCL 2.5 MG/ML IV SOLN
INTRAVENOUS | Status: AC
  Filled 2022-06-15: qty 2

## 2022-06-15 MED ORDER — MIDAZOLAM HCL 2 MG/2ML IJ SOLN
INTRAMUSCULAR | Status: AC
  Filled 2022-06-15: qty 2

## 2022-06-15 MED ORDER — NITROGLYCERIN 1 MG/10 ML FOR IR/CATH LAB: INTRA_ARTERIAL | Status: AC | PRN

## 2022-06-15 MED ORDER — HEPARIN SODIUM (PORCINE) 1000 UNIT/ML IJ SOLN
INTRAMUSCULAR | Status: AC
  Filled 2022-06-15: qty 10

## 2022-06-15 MED ORDER — MIDAZOLAM HCL 2 MG/2ML IJ SOLN
INTRAMUSCULAR | Status: AC | PRN
  Administered 2022-06-15: 1 mg via INTRAVENOUS

## 2022-06-15 MED ORDER — HYDROCODONE-ACETAMINOPHEN 5-325 MG PO TABS: 1.0000 | ORAL_TABLET | ORAL | Status: DC | PRN

## 2022-06-15 MED ORDER — FENTANYL CITRATE (PF) 100 MCG/2ML IJ SOLN
INTRAMUSCULAR | Status: AC
  Filled 2022-06-15: qty 2

## 2022-06-15 NOTE — H&P (Signed)
Chief Complaint   Aneurysm  History of Present Illness  [name] is a 77 y.o. male who underwent elective right pterional craniotomy for clipping of RMCA aneurysm one year ago. He has done very well from a neurologic standpoint and presents today for routine angiographic follow-up.  Past Medical History   Past Medical History:  Diagnosis Date   BPH with urinary obstruction    Diabetes mellitus without complication ([place])    Fatty liver    Hyperlipidemia    Stroke [facility]) 01/2021    Past Surgical History   Past Surgical History:  Procedure Laterality Date   BIOPSY  05/08/2021   Procedure: BIOPSY;  Surgeon: Yetta Flock, MD;  Location: [place];  Service: Gastroenterology;;   CRANIOTOMY Right 06/08/2021   Procedure: FRONTOTEMPORAL CRANIOTOMY FOR CLIPPING OF MCA ANEURYSM;  Surgeon: Consuella Lose, MD;  Location: [place];  Service: Neurosurgery;  Laterality: Right;   CYSTOSCOPY     ESOPHAGOGASTRODUODENOSCOPY (EGD) WITH PROPOFOL N/A 05/08/2021   Procedure: ESOPHAGOGASTRODUODENOSCOPY (EGD) WITH PROPOFOL;  Surgeon: Yetta Flock, MD;  Location: [place];  Service: Gastroenterology;  Laterality: N/A;   IR 3D INDEPENDENT WKST  04/04/2021   IR ANGIO INTRA EXTRACRAN SEL INTERNAL CAROTID BILAT MOD SED  04/04/2021   IR ANGIO VERTEBRAL SEL VERTEBRAL UNI L MOD SED  04/04/2021   STERNOCLEIDOMASTOID FOR TORTICOLLIS  1951    Social History   Social History   Tobacco Use   Smoking status: Former    Types: Cigarettes   Smokeless tobacco: Never  Vaping Use   Vaping Use: Never used  Substance Use Topics   Alcohol use: Yes    Comment: occasional-about 1 beer a month   Drug use: Not Currently    Medications   Prior to Admission medications   Medication Sig Start Date End Date Taking? Authorizing Provider  docusate sodium (COLACE) 100 MG capsule Take 300 mg by mouth at bedtime.    [provider]  Multiple Vitamin (MULTIVITAMIN WITH MINERALS)  TABS tablet Take 1 tablet by mouth daily.    [provider]  nystatin ointment (MYCOSTATIN) Apply 1 application topically 2 (two) times daily. 03/30/21   Stoneking, Reece Leader., MD  pantoprazole (PROTONIX) 40 MG tablet Take 1 tablet (40 mg total) by mouth 2 (two) times daily for 42 days, THEN 1 tablet (40 mg total) daily. 05/09/21 08/07/21  Mercy Riding, MD  tamsulosin (FLOMAX) 0.4 MG CAPS capsule Take 0.4 mg by mouth every other day.    [provider]  triamcinolone cream (KENALOG) 0.1 % Apply 1 application topically daily. 02/06/21   [provider]    Allergies   Allergies  Allergen Reactions   Other Nausea And Vomiting    Blue cheese    Shellfish Allergy Nausea And Vomiting    mussels    Review of Systems  ROS  Neurologic Exam  Awake, alert, oriented Memory and concentration grossly intact Speech fluent, appropriate CN grossly intact Motor exam: Upper Extremities Deltoid Bicep Tricep Grip  Right 5/5 5/5 5/5 5/5  Left 5/5 5/5 5/5 5/5   Lower Extremities IP Quad PF DF EHL  Right 5/5 5/5 5/5 5/5 5/5  Left 5/5 5/5 5/5 5/5 5/5   Sensation grossly intact to LT  Impression  - 77 y.o. male 1 yrs s/p elective clipping of RMCA aneurysm  Plan  - Will proceed with routine f/u angiogram  I have reviewed the indications for the procedure as well as the details of the  procedure and the expected postoperative course and recovery at length with the patient in the office. We have also reviewed in detail the risks, benefits, and alternatives to the procedure. All questions were answered and [name] provided informed consent to proceed.  Consuella Lose, MD [facility]

## 2022-06-15 NOTE — Brief Op Note (Signed)
  NEUROSURGERY BRIEF OPERATIVE  NOTE   PREOP DX: RMCA aneurysm  POSTOP DX: Same  PROCEDURE: Diagnostic cerebral angiogram  SURGEON: Dr. Consuella Lose, MD  ANESTHESIA: IV Sedation with Local  APPROACH: Right trans-radial  EBL: Minimal  SPECIMENS: None  COMPLICATIONS: None  CONDITION: Stable to recovery  FINDINGS (Full report in [facility]): 1. Complete occlusion of RMCA aneurysm 1 yr after clipping. No MCA stenosis is seen.   Consuella Lose, MD [facility]

## 2022-06-15 NOTE — Progress Notes (Signed)
Patient and wife was given discharge instructions. Both verbalized understanding. 

## 2022-06-15 NOTE — Discharge Instructions (Signed)

## 2022-06-18 ENCOUNTER — Other Ambulatory Visit: Payer: Self-pay | Admitting: Neurosurgery

## 2022-06-18 ENCOUNTER — Encounter (HOSPITAL_COMMUNITY): Payer: Self-pay

## 2022-06-18 DIAGNOSIS — I671 Cerebral aneurysm, nonruptured: Secondary | ICD-10-CM

## 2022-06-18 HISTORY — PX: IR US GUIDE VASC ACCESS RIGHT: IMG2390

## 2022-06-25 DIAGNOSIS — I671 Cerebral aneurysm, nonruptured: Secondary | ICD-10-CM | POA: Diagnosis not present

## 2022-08-22 DIAGNOSIS — E7849 Other hyperlipidemia: Secondary | ICD-10-CM | POA: Diagnosis not present

## 2022-08-22 DIAGNOSIS — K2100 Gastro-esophageal reflux disease with esophagitis, without bleeding: Secondary | ICD-10-CM | POA: Diagnosis not present

## 2022-08-22 DIAGNOSIS — E1165 Type 2 diabetes mellitus with hyperglycemia: Secondary | ICD-10-CM | POA: Diagnosis not present

## 2022-08-29 DIAGNOSIS — N401 Enlarged prostate with lower urinary tract symptoms: Secondary | ICD-10-CM | POA: Diagnosis not present

## 2022-08-29 DIAGNOSIS — E7849 Other hyperlipidemia: Secondary | ICD-10-CM | POA: Diagnosis not present

## 2022-08-29 DIAGNOSIS — M791 Myalgia, unspecified site: Secondary | ICD-10-CM | POA: Diagnosis not present

## 2022-08-29 DIAGNOSIS — R809 Proteinuria, unspecified: Secondary | ICD-10-CM | POA: Diagnosis not present

## 2022-08-29 DIAGNOSIS — I10 Essential (primary) hypertension: Secondary | ICD-10-CM | POA: Diagnosis not present

## 2022-08-29 DIAGNOSIS — E1129 Type 2 diabetes mellitus with other diabetic kidney complication: Secondary | ICD-10-CM | POA: Diagnosis not present

## 2022-08-29 DIAGNOSIS — T466X5A Adverse effect of antihyperlipidemic and antiarteriosclerotic drugs, initial encounter: Secondary | ICD-10-CM | POA: Diagnosis not present

## 2022-08-29 DIAGNOSIS — R748 Abnormal levels of other serum enzymes: Secondary | ICD-10-CM | POA: Diagnosis not present

## 2022-08-29 DIAGNOSIS — E875 Hyperkalemia: Secondary | ICD-10-CM | POA: Diagnosis not present

## 2022-09-05 DIAGNOSIS — K7581 Nonalcoholic steatohepatitis (NASH): Secondary | ICD-10-CM | POA: Diagnosis not present

## 2022-09-05 DIAGNOSIS — K7689 Other specified diseases of liver: Secondary | ICD-10-CM | POA: Diagnosis not present

## 2022-12-19 DIAGNOSIS — E7849 Other hyperlipidemia: Secondary | ICD-10-CM | POA: Diagnosis not present

## 2022-12-19 DIAGNOSIS — E875 Hyperkalemia: Secondary | ICD-10-CM | POA: Diagnosis not present

## 2022-12-19 DIAGNOSIS — E119 Type 2 diabetes mellitus without complications: Secondary | ICD-10-CM | POA: Diagnosis not present

## 2022-12-19 DIAGNOSIS — R5383 Other fatigue: Secondary | ICD-10-CM | POA: Diagnosis not present

## 2022-12-26 DIAGNOSIS — R748 Abnormal levels of other serum enzymes: Secondary | ICD-10-CM | POA: Diagnosis not present

## 2022-12-26 DIAGNOSIS — T466X5A Adverse effect of antihyperlipidemic and antiarteriosclerotic drugs, initial encounter: Secondary | ICD-10-CM | POA: Diagnosis not present

## 2022-12-26 DIAGNOSIS — M791 Myalgia, unspecified site: Secondary | ICD-10-CM | POA: Diagnosis not present

## 2022-12-26 DIAGNOSIS — E7849 Other hyperlipidemia: Secondary | ICD-10-CM | POA: Diagnosis not present

## 2022-12-26 DIAGNOSIS — N401 Enlarged prostate with lower urinary tract symptoms: Secondary | ICD-10-CM | POA: Diagnosis not present

## 2022-12-26 DIAGNOSIS — R809 Proteinuria, unspecified: Secondary | ICD-10-CM | POA: Diagnosis not present

## 2022-12-26 DIAGNOSIS — I10 Essential (primary) hypertension: Secondary | ICD-10-CM | POA: Diagnosis not present

## 2022-12-26 DIAGNOSIS — E875 Hyperkalemia: Secondary | ICD-10-CM | POA: Diagnosis not present

## 2022-12-26 DIAGNOSIS — E1129 Type 2 diabetes mellitus with other diabetic kidney complication: Secondary | ICD-10-CM | POA: Diagnosis not present

## 2023-04-22 DIAGNOSIS — E1129 Type 2 diabetes mellitus with other diabetic kidney complication: Secondary | ICD-10-CM | POA: Diagnosis not present

## 2023-04-22 DIAGNOSIS — E782 Mixed hyperlipidemia: Secondary | ICD-10-CM | POA: Diagnosis not present

## 2023-04-22 DIAGNOSIS — I10 Essential (primary) hypertension: Secondary | ICD-10-CM | POA: Diagnosis not present

## 2023-04-22 DIAGNOSIS — E7849 Other hyperlipidemia: Secondary | ICD-10-CM | POA: Diagnosis not present

## 2023-04-22 DIAGNOSIS — E1165 Type 2 diabetes mellitus with hyperglycemia: Secondary | ICD-10-CM | POA: Diagnosis not present

## 2023-04-29 DIAGNOSIS — E7849 Other hyperlipidemia: Secondary | ICD-10-CM | POA: Diagnosis not present

## 2023-04-29 DIAGNOSIS — E1165 Type 2 diabetes mellitus with hyperglycemia: Secondary | ICD-10-CM | POA: Diagnosis not present

## 2023-04-29 DIAGNOSIS — I10 Essential (primary) hypertension: Secondary | ICD-10-CM | POA: Diagnosis not present

## 2023-04-29 DIAGNOSIS — T466X5A Adverse effect of antihyperlipidemic and antiarteriosclerotic drugs, initial encounter: Secondary | ICD-10-CM | POA: Diagnosis not present

## 2023-04-29 DIAGNOSIS — R748 Abnormal levels of other serum enzymes: Secondary | ICD-10-CM | POA: Diagnosis not present

## 2023-04-29 DIAGNOSIS — Z0001 Encounter for general adult medical examination with abnormal findings: Secondary | ICD-10-CM | POA: Diagnosis not present

## 2023-04-29 DIAGNOSIS — N401 Enlarged prostate with lower urinary tract symptoms: Secondary | ICD-10-CM | POA: Diagnosis not present

## 2023-04-29 DIAGNOSIS — E1129 Type 2 diabetes mellitus with other diabetic kidney complication: Secondary | ICD-10-CM | POA: Diagnosis not present

## 2023-04-29 DIAGNOSIS — R809 Proteinuria, unspecified: Secondary | ICD-10-CM | POA: Diagnosis not present

## 2023-06-10 ENCOUNTER — Other Ambulatory Visit (HOSPITAL_COMMUNITY): Payer: Self-pay | Admitting: Neurosurgery

## 2023-06-10 DIAGNOSIS — I671 Cerebral aneurysm, nonruptured: Secondary | ICD-10-CM

## 2023-06-19 ENCOUNTER — Encounter (HOSPITAL_COMMUNITY): Payer: Self-pay

## 2023-06-19 ENCOUNTER — Ambulatory Visit (HOSPITAL_COMMUNITY)
Admission: RE | Admit: 2023-06-19 | Discharge: 2023-06-19 | Disposition: A | Discharge status: Home / Self Care | Payer: Medicare HMO | Source: Ambulatory Visit | Attending: Neurosurgery | Admitting: Neurosurgery

## 2023-06-19 DIAGNOSIS — I6621 Occlusion and stenosis of right posterior cerebral artery: Secondary | ICD-10-CM | POA: Diagnosis not present

## 2023-06-19 DIAGNOSIS — I671 Cerebral aneurysm, nonruptured: Secondary | ICD-10-CM | POA: Insufficient documentation

## 2023-07-24 DIAGNOSIS — Z6835 Body mass index (BMI) 35.0-35.9, adult: Secondary | ICD-10-CM | POA: Diagnosis not present

## 2023-07-24 DIAGNOSIS — I671 Cerebral aneurysm, nonruptured: Secondary | ICD-10-CM | POA: Diagnosis not present

## 2023-08-07 IMAGING — XA IR CAROTID INTERNAL HEAD/NECK BILAT  (MS)
8 of 10 series · 12 of 24 positions shown · IV contrast (IODINE)
Comparison: none

PROCEDURE:
DIAGNOSTIC CEREBRAL ANGIOGRAM
HISTORY: The patient is a 75-year-old man initially seen in the Outpatient
neurosurgery clinic after being referred with incidental discovery
of a right middle cerebral artery aneurysm. Briefly, the patient was
admitted at an outside hospital for urosepsis where he was being
worked up for mental status change. This included MRI scan which
incidentally revealed the aneurysm. After he made a recovery, he
moved to the [HOSPITAL] area and was referred to me for further
evaluation of the incidental aneurysm. He therefore presents today
for diagnostic cerebral angiogram.
TECHNIQUE: CATHETERS AND WIRES
5-French JB-1 catheter

[Series 1: cerebral care 2 · 2 acquisitions, 1 frame shown (1 of 7)]
[im 1/2]
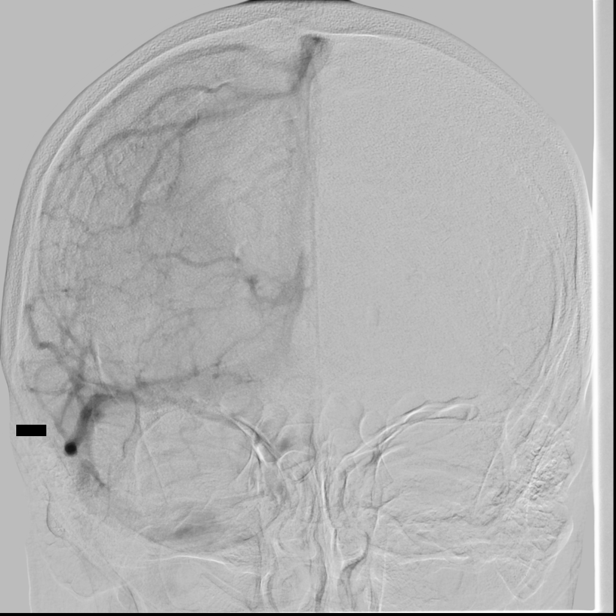

[Series 2: cerebral care 2 · 2 acquisitions, 2 frames shown (2 of 7)]
[im 1/2]
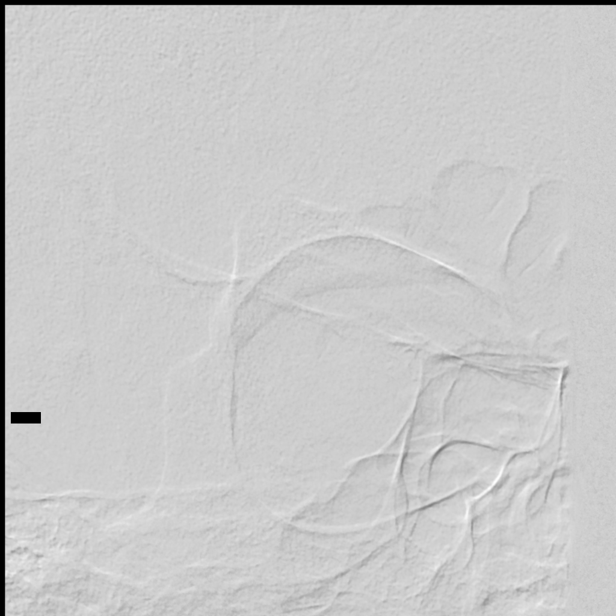
[im 2/2]
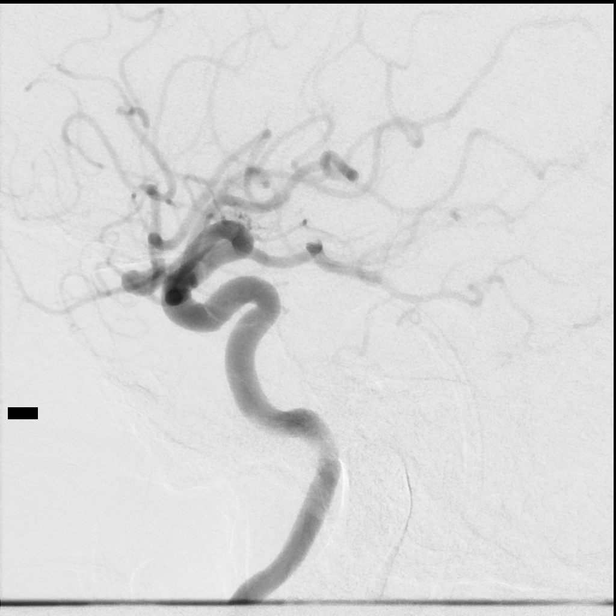

[Series 3: cerebral care 2 · 2 acquisitions, 2 frames shown (3 of 7)]
[im 1/2]
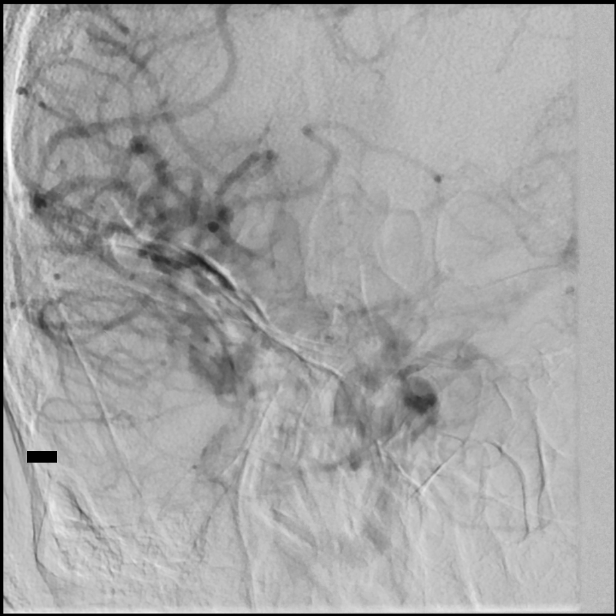
[im 2/2]
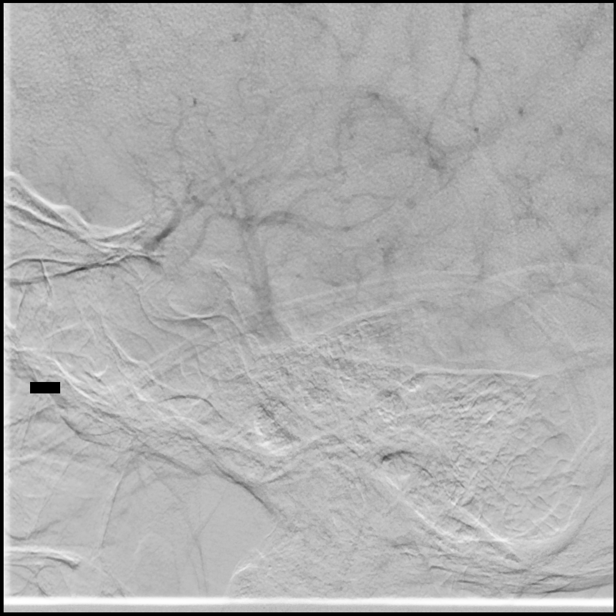

[Series 5: cerebral care 2 · 2 acquisitions, 1 frame shown (4 of 7)]
[im 1/2]
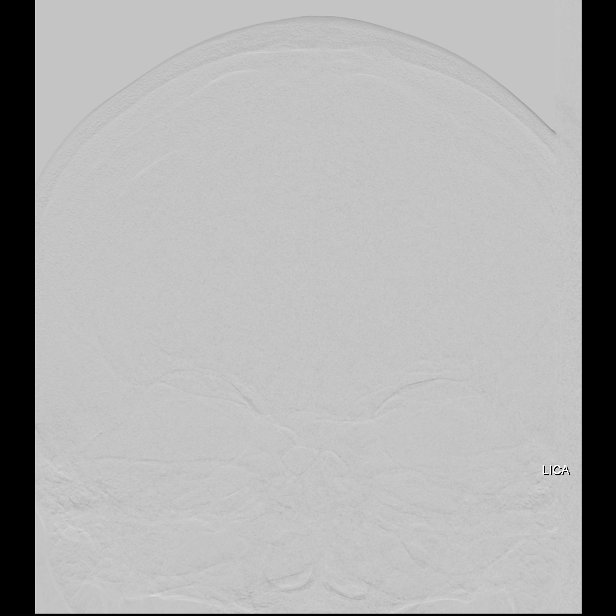

[Series 6: cerebral care 2 · 2 acquisitions, 1 frame shown (5 of 7)]
[im 1/2]
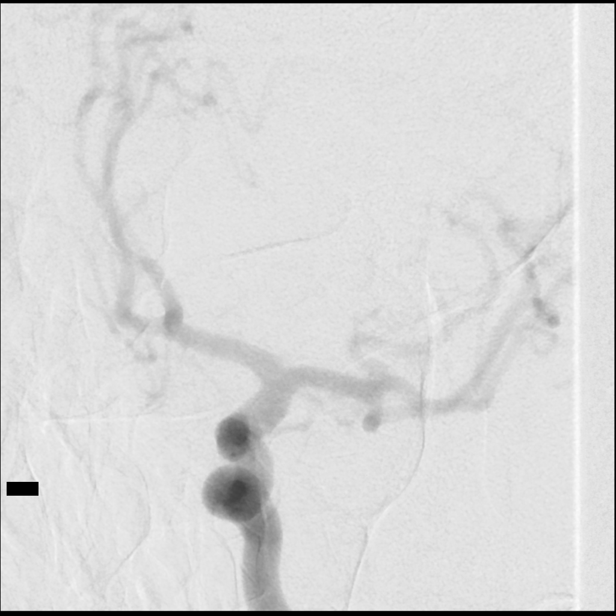

[Series 7: cerebral care 2 · 2 acquisitions, 1 frame shown (6 of 7)]
[im 1/2]
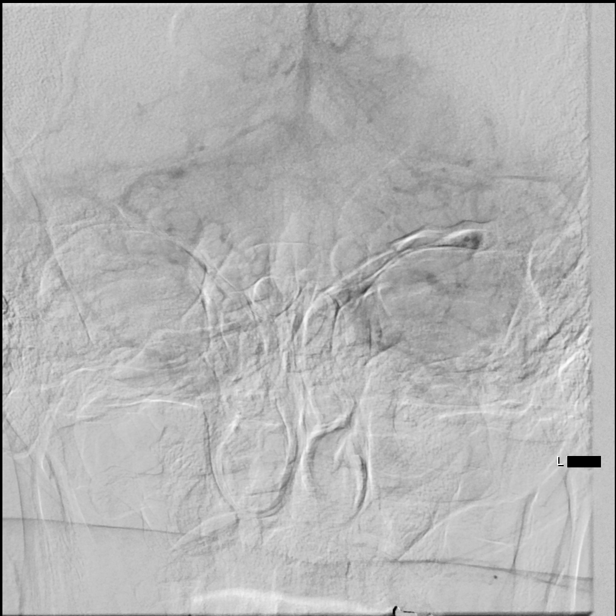

[Series 8: cerebral care 2 · 2 acquisitions, 1 frame shown (7 of 7)]
[im 1/2]
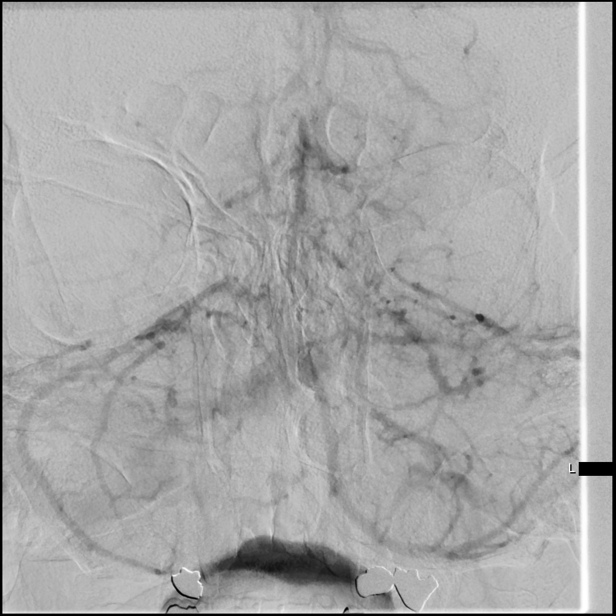

[Series 300: dr. (person_name) · 3 of 16 slices shown]
[im 2/16]
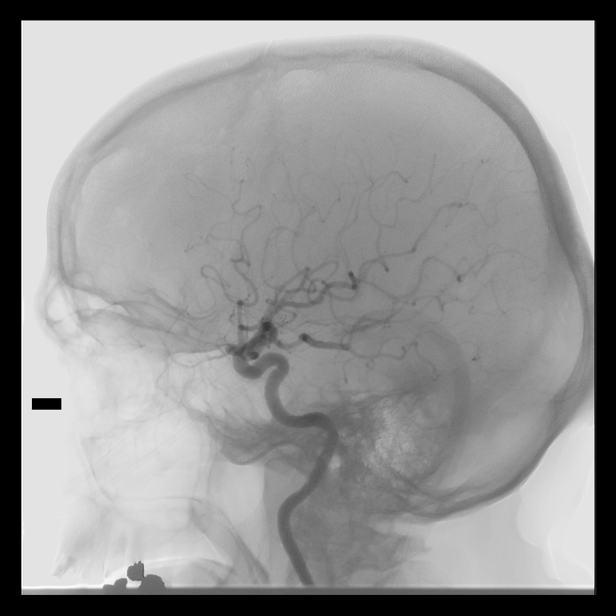
[im 9/16]
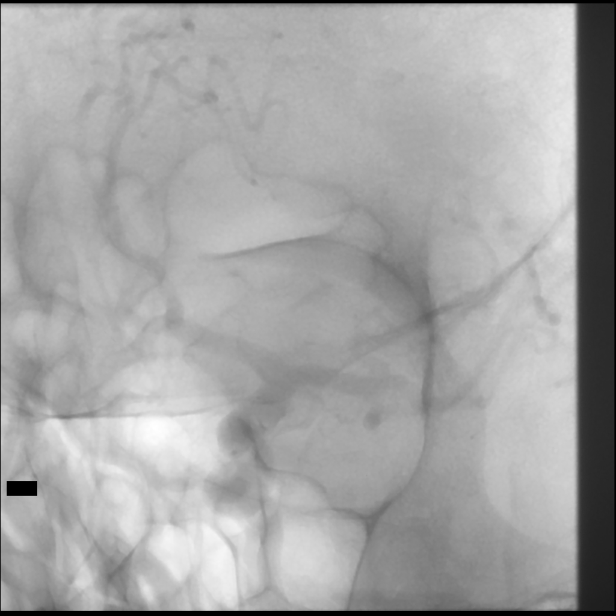
[im 16/16]
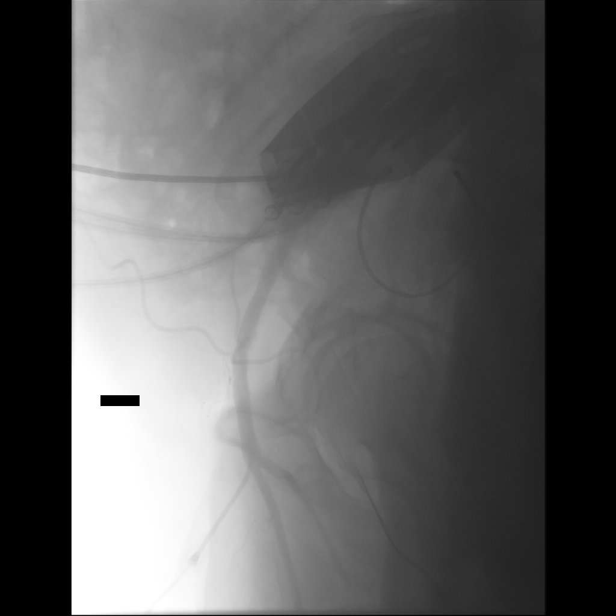

[12 of 24 positions shown; findings below may reference images not displayed]

ACCESS:
The technical aspects of the procedure as well as its potential
risks and benefits were reviewed with the patient. These risks
included but were not limited bleeding, infection, allergic
reaction, damage to organs or vital structures, stroke,
non-diagnostic procedure, and the catastrophic outcomes of heart
attack, coma, and death. With an understanding of these risks,
informed consent was obtained and witnessed. The patient was placed
in the supine position on the angiography table and the skin of
right groin prepped in the usual sterile fashion.

The procedure was performed under local anesthesia (1%-solution of
bicarbonate-buffered Lidocaine) and conscious sedation with 1mg
versed and 79micrograms fentanyl monitored by myself and the
in-suite nurse using continuous pulse-oximetry, heart rate, and
non-invasive blood-pressure.

A 5- French sheath was introduced in the right common femoral artery
using Seldinger technique. A fluoro-phase sequence was used to
document the sheath position.

MEDICATIONS:
HEPARIN: 2111 Units total.

CONTRAST:  100mL OMNIPAQUE IOHEXOL 300 MG/ML  SOLNcc, Omnipaque 300

FLUOROSCOPY TIME:  FLUOROSCOPY TIME: See IR records
0.035" glidewire

VESSELS CATHETERIZED
Right internal carotid

Left internal carotid

Left vertebral

Right common femoral

VESSELS STUDIED
Right internal carotid, head

Right internal carotid, 3D rotational angiogram

Left internal carotid, head

Left vertebral

Right femoral

PROCEDURAL NARRATIVE
A 5-Fr JB-1 catheter was advanced over a 0.035 glidewire into the
aortic arch. The above vessels were then sequentially catheterized
and cervical / cerebral angiograms taken. After review of images,
the catheter was removed without incident.
FINDINGS: Right internal carotid, head:

Injection reveals the presence of a widely patent ICA and M1
segments and branches. The right A1 is noted to be hypoplastic.
There is an anteriorly projecting aneurysm of the right middle
cerebral artery bifurcation better delineated on the
three-dimensional rotational angiogram. The parenchymal and venous
phases are normal. The venous sinuses are widely patent.

Right internal carotid, 3D rotation

3-dimensional rotational angiographic images were reconstructed on
an independent workstation for review. These further delineate the
above-described right middle cerebral artery bifurcation aneurysm.
The neck of the aneurysm is relatively wide and incorporates the
inferior division of the MCA. Overall dimensions of the aneurysm are
proximally 7.6 mm tall by approximately 4.6mm. Morphology of the
aneurysm appears to demonstrate Dyamond Ignacio tip.

Left internal carotid, head:

Injection reveals the presence of a widely patent ICA, A1, and M1
segments and their branches. Bilateral ACA territories are supplied
through this left-sided injection. No aneurysms, AVMs, or high-flow
fistulas are seen. The parenchymal and venous phases are normal. The
venous sinuses are widely patent.

Left vertebral:

Injection reveals the presence of a widely patent vertebral artery.
This leads to a widely patent basilar artery that terminates in
bilateral P1. The basilar apex is normal. No aneurysms, AVMs, or
high-flow fistulas are seen. The parenchymal and venous phases are
normal. The venous sinuses are widely patent.

Right femoral:

Normal vessel. No significant atherosclerotic disease. Arterial
sheath in adequate position.

DISPOSITION:
Upon completion of the study, the femoral sheath was removed and
hemostasis obtained using a 5-Fr ExoSeal closure device. Good
proximal and distal lower extremity pulses were documented upon
achievement of hemostasis. The procedure was well tolerated and no
early complications were observed. The patient was transferred to
the holding area to lay flat for 2 hours.
IMPRESSION: 1. Wide neck 7.6 mm right middle cerebral artery bifurcation
aneurysm incorporates the origin of the inferior division, as
described above.

2.  No other intracranial aneurysms, AVMs, or fistulas are seen.

The preliminary results of this procedure were shared with the
patient and the patient's family.

## 2023-09-02 DIAGNOSIS — E875 Hyperkalemia: Secondary | ICD-10-CM | POA: Diagnosis not present

## 2023-09-02 DIAGNOSIS — R7301 Impaired fasting glucose: Secondary | ICD-10-CM | POA: Diagnosis not present

## 2023-09-02 DIAGNOSIS — E7849 Other hyperlipidemia: Secondary | ICD-10-CM | POA: Diagnosis not present

## 2023-09-02 DIAGNOSIS — E1129 Type 2 diabetes mellitus with other diabetic kidney complication: Secondary | ICD-10-CM | POA: Diagnosis not present

## 2023-09-02 DIAGNOSIS — E782 Mixed hyperlipidemia: Secondary | ICD-10-CM | POA: Diagnosis not present

## 2023-09-09 DIAGNOSIS — Z6837 Body mass index (BMI) 37.0-37.9, adult: Secondary | ICD-10-CM | POA: Diagnosis not present

## 2023-09-09 DIAGNOSIS — E1165 Type 2 diabetes mellitus with hyperglycemia: Secondary | ICD-10-CM | POA: Diagnosis not present

## 2023-09-09 DIAGNOSIS — Z8673 Personal history of transient ischemic attack (TIA), and cerebral infarction without residual deficits: Secondary | ICD-10-CM | POA: Diagnosis not present

## 2023-09-09 DIAGNOSIS — I10 Essential (primary) hypertension: Secondary | ICD-10-CM | POA: Diagnosis not present

## 2023-09-09 DIAGNOSIS — Z23 Encounter for immunization: Secondary | ICD-10-CM | POA: Diagnosis not present

## 2023-09-09 DIAGNOSIS — E782 Mixed hyperlipidemia: Secondary | ICD-10-CM | POA: Diagnosis not present

## 2023-09-09 DIAGNOSIS — E7849 Other hyperlipidemia: Secondary | ICD-10-CM | POA: Diagnosis not present

## 2023-11-19 DIAGNOSIS — H34231 Retinal artery branch occlusion, right eye: Secondary | ICD-10-CM | POA: Diagnosis not present

## 2024-01-02 DIAGNOSIS — I10 Essential (primary) hypertension: Secondary | ICD-10-CM | POA: Diagnosis not present

## 2024-01-02 DIAGNOSIS — N179 Acute kidney failure, unspecified: Secondary | ICD-10-CM | POA: Diagnosis not present

## 2024-01-02 DIAGNOSIS — E1165 Type 2 diabetes mellitus with hyperglycemia: Secondary | ICD-10-CM | POA: Diagnosis not present

## 2024-01-02 DIAGNOSIS — E7849 Other hyperlipidemia: Secondary | ICD-10-CM | POA: Diagnosis not present

## 2024-01-09 DIAGNOSIS — E782 Mixed hyperlipidemia: Secondary | ICD-10-CM | POA: Diagnosis not present

## 2024-01-09 DIAGNOSIS — Z6837 Body mass index (BMI) 37.0-37.9, adult: Secondary | ICD-10-CM | POA: Diagnosis not present

## 2024-01-09 DIAGNOSIS — E1165 Type 2 diabetes mellitus with hyperglycemia: Secondary | ICD-10-CM | POA: Diagnosis not present

## 2024-01-09 DIAGNOSIS — I10 Essential (primary) hypertension: Secondary | ICD-10-CM | POA: Diagnosis not present

## 2024-01-09 DIAGNOSIS — I671 Cerebral aneurysm, nonruptured: Secondary | ICD-10-CM | POA: Diagnosis not present

## 2024-01-09 DIAGNOSIS — E7849 Other hyperlipidemia: Secondary | ICD-10-CM | POA: Diagnosis not present

## 2024-06-16 ENCOUNTER — Ambulatory Visit: Admitting: Internal Medicine

## 2024-06-16 ENCOUNTER — Encounter: Payer: Self-pay | Admitting: Internal Medicine

## 2024-06-16 ENCOUNTER — Ambulatory Visit (HOSPITAL_COMMUNITY)
Admission: RE | Admit: 2024-06-16 | Discharge: 2024-06-16 | Disposition: A | Discharge status: Home / Self Care | Source: Ambulatory Visit | Attending: Internal Medicine | Admitting: Internal Medicine

## 2024-06-16 VITALS — BP 145/81 | HR 98 | Ht 74.0 in | Wt 265.0 lb

## 2024-06-16 DIAGNOSIS — R0609 Other forms of dyspnea: Secondary | ICD-10-CM | POA: Insufficient documentation

## 2024-06-16 DIAGNOSIS — Z87891 Personal history of nicotine dependence: Secondary | ICD-10-CM | POA: Diagnosis not present

## 2024-06-16 NOTE — Progress Notes (Signed)
 "   William Leonard, male    DOB: Mar 22, 1946    MRN: [id number]   Brief patient profile:  78  yowm from [place] quit smoking 1992  referred to pulmonary clinic in [place]  06/16/2024 by ER  for ? Pna vs PF  p overnight stay in ER  06/04/2024 with dx of CAP vs uti  Worked as curator doing some brake work   Pt not  seen by [facility] 2022 for sepsis/ GIB by CCM team.   History of Present Illness  06/16/2024  Pulmonary/ 1st office eval/ [place] Date / Sales Executive Complaint  Patient presents with   Establish Care    Hsp for pna, diagnosed with pulmonary fibrosis via CT scan Min coughing   Dyspnea:  no problem with [employer] pushing cart limited by  legs not breathing = baseline  Cough: occasionally with no excess mucus / no am flair .  Sleep: bed is flat 2 pillows on side no problem  SABA use: none  02: none     No obvious day to day or daytime pattern/variability or assoc excess/ purulent sputum or mucus plugs or hemoptysis or cp or chest tightness, subjective wheeze or overt sinus or hb symptoms.    Also denies any obvious fluctuation of symptoms with weather or environmental changes or other aggravating or alleviating factors except as outlined above   No unusual exposure hx or h/o childhood pna/ asthma or knowledge of premature birth.  Current Allergies, Complete Past Medical History, Past Surgical History, Family History, and Social History were reviewed in [employer] record.  ROS  The following are not active complaints unless bolded Hoarseness, sore throat, dysphagia, dental problems, itching, sneezing,  nasal congestion or discharge of excess mucus or purulent secretions, ear ache,   fever, chills, sweats, unintended wt loss or wt gain, classically pleuritic or exertional cp,  orthopnea pnd or arm/hand swelling  or leg swelling, presyncope, palpitations, abdominal pain, anorexia, nausea, vomiting, diarrhea  or change in bowel habits or change in  bladder habits, change in stools or change in urine, dysuria, hematuria,  rash, arthralgias, visual complaints, headache, numbness, weakness or ataxia or problems with walking or coordination,  change in mood or  memory.            Outpatient Medications Prior to Visit  Medication Sig Dispense Refill   aspirin EC 81 MG tablet Take 81 mg by mouth daily. Swallow whole.     Cholecalciferol (VITAMIN D-3) 25 MCG (1000 UT) CAPS Take by mouth.     ezetimibe (ZETIA) 10 MG tablet Take 10 mg by mouth daily.     lisinopril (ZESTRIL) 2.5 MG tablet Take 2.5 mg by mouth daily.     metformin (FORTAMET) 500 MG (OSM) 24 hr tablet Take 500 mg by mouth.     Multiple Vitamin (MULTIVITAMIN WITH MINERALS) TABS tablet Take 1 tablet by mouth daily.     tamsulosin  (FLOMAX ) 0.4 MG CAPS capsule Take 0.4 mg by mouth every other day.     nystatin  ointment (MYCOSTATIN ) Apply 1 application topically 2 (two) times daily. (Patient not taking: Reported on 06/16/2024) 30 g 1   triamcinolone  cream (KENALOG ) 0.1 % Apply 1 application topically daily. (Patient not taking: Reported on 06/16/2024)     No facility-administered medications prior to visit.    Past Medical History:  Diagnosis Date   BPH with urinary obstruction    Diabetes mellitus without complication (HCC)    Fatty liver  Hyperlipidemia    Stroke (HCC) 01/2021      Objective:     BP (!) 145/81   Pulse 98   Ht 6' 2 (1.88 m)   Wt 265 lb (120.2 kg)   SpO2 93% Comment: ra  BMI 34.02 kg/m   SpO2: 93 % (ra) amb mod obese (by BMI) wm strong NH accent/ nad   HEENT : Oropharynx  clear      Nasal turbinates nl    NECK :  without  apparent JVD/ palpable Nodes/TM    LUNGS: no acc muscle use,  Nl contour chest with minimal insp crackles in bases but  without cough on insp or exp maneuvers   CV:  RRR  no s3 or murmur or increase in P2, and no edema   ABD:  soft and nontender   MS:  Gait nl   ext warm without deformities Or obvious joint  restrictions  calf tenderness, cyanosis or clubbing    SKIN: warm and dry without lesions    NEURO:  alert, approp, nl sensorium with  no motor or cerebellar deficits apparent.    CXR PA and Lateral:   06/16/2024 :    I personally reviewed images and impression is as follows:     Scarring both lower lobes R > L   CT chest with contrast 06/02/24 from [facility] Comparison:   06/24/2009 LUNGS: There is adequate inspiratory lung volume. There is underlying chronic lung disease again noted similar to the prior exam. There is now a tiny streaky region of consolidation along the lateral right upper lobe extending to the lung periphery. There is a tiny nodular component along the lateral right upper lobe measuring 6 mm. Pleural parenchymal fibrosis is also seen along along periphery of the left upper lobe and lung bases bilaterally, left side greater than right. These are unchanged from the prior exam.      Assessment   Assessment & Plan DOE (dyspnea on exertion) Quit smoking 1990s with prob asbestos exp with brake work - Onset ?  With chronic changes in both lower lobes dating back to ? 06/24/09 per radiology notes for [facility]  - 06/16/2024   Walked on RA  x  3  lap(s) =  approx 450  ft  @ moderate pace, stopped due to leg pain  with lowest 02 sats 92%  min sob   No convincing evidence clinically of a progressive PF pattern and too soon to order HRCT p acute infection for fear of confusing the picture with ALI  response.  Instead rec serial ex sats x 3 months and return for pfts - call sooner if losing ground with ex tol or ex sats  Discussed in detail all the  indications, usual  risks and alternatives  relative to the benefits with patient who agrees to proceed with conservative f/u as outlined    Each maintenance medication was reviewed in detail including emphasizing most importantly the difference between maintenance and prns and under what circumstances the prns are to be triggered using an  action plan format where appropriate.  Total time for H and P, chart review, counseling,  directly observing portions of ambulatory 02 saturation study/ and generating customized AVS unique to this office visit / same day charting = 65 min new pt eval with possible PF ? etiology                AVS  Patient Instructions  Please remember to go to the  x-ray  department  @  [facility] for your tests - we will call you with the results when they are available      Make sure you check your oxygen saturation at your highest level of activity(NOT after you stop)  to be sure it stays over 90% and keep track of it at least once a week, more often if breathing getting worse, and let me know if losing ground. (Collect the dots to connect the dots approach)     Please schedule a follow up visit within 3 months but call sooner if needed with PFTS on return (1st available with office to follow)       Ozell America, MD 06/16/2024      "

## 2024-06-16 NOTE — Patient Instructions (Signed)
 Please remember to go to the  x-ray department  @  [facility] for your tests - we will call you with the results when they are available      Make sure you check your oxygen saturation at your highest level of activity(NOT after you stop)  to be sure it stays over 90% and keep track of it at least once a week, more often if breathing getting worse, and let me know if losing ground. (Collect the dots to connect the dots approach)     Please schedule a follow up visit within 3 months but call sooner if needed with PFTS on return (1st available with office to follow)

## 2024-06-16 NOTE — Assessment & Plan Note (Addendum)
 Quit smoking 1990s with prob asbestos exp with brake work - Onset ?  With chronic changes in both lower lobes dating back to ? 06/24/09 per radiology notes for [facility]  - 06/16/2024   Walked on RA  x  3  lap(s) =  approx 450  ft  @ moderate pace, stopped due to leg pain  with lowest 02 sats 92%  min sob   No convincing evidence clinically of a progressive PF pattern and too soon to order HRCT p acute infection for fear of confusing the picture with ALI  response.  Instead rec serial ex sats x 3 months and return for pfts - call sooner if losing ground with ex tol or ex sats  Discussed in detail all the  indications, usual  risks and alternatives  relative to the benefits with patient who agrees to proceed with conservative f/u as outlined    Each maintenance medication was reviewed in detail including emphasizing most importantly the difference between maintenance and prns and under what circumstances the prns are to be triggered using an action plan format where appropriate.  Total time for H and P, chart review, counseling,  directly observing portions of ambulatory 02 saturation study/ and generating customized AVS unique to this office visit / same day charting = 65 min new pt eval with possible PF ? etiology

## 2024-06-20 ENCOUNTER — Ambulatory Visit: Payer: Self-pay | Admitting: Internal Medicine

## 2024-06-23 NOTE — Telephone Encounter (Signed)
 Called and relayed results to pt and he confirmed understanding

## 2024-09-15 ENCOUNTER — Ambulatory Visit: Admitting: Internal Medicine
